# Patient Record
Sex: Female | Born: 1995 | Race: Black or African American | Hispanic: No | Marital: Single | Smoking: Never smoker
Health system: Southern US, Community
[De-identification: ages and names within clinical notes are randomized; demographics above are authoritative.]

## PROBLEM LIST (undated history)

## (undated) DIAGNOSIS — E119 Type 2 diabetes mellitus without complications: Secondary | ICD-10-CM

## (undated) HISTORY — PX: NO PAST SURGERIES: SHX2092

---

## 2018-12-05 HISTORY — PX: WRIST FUSION: SHX839

## 2019-09-09 ENCOUNTER — Emergency Department (HOSPITAL_COMMUNITY)
Admission: EM | Admit: 2019-09-09 | Discharge: 2019-09-09 | Disposition: A | Discharge status: Home / Self Care | Payer: BC Managed Care – PPO | Attending: Emergency Medicine | Admitting: Emergency Medicine

## 2019-09-09 ENCOUNTER — Encounter (HOSPITAL_COMMUNITY): Payer: Self-pay | Admitting: Emergency Medicine

## 2019-09-09 ENCOUNTER — Other Ambulatory Visit: Payer: Self-pay

## 2019-09-09 DIAGNOSIS — E119 Type 2 diabetes mellitus without complications: Secondary | ICD-10-CM | POA: Diagnosis not present

## 2019-09-09 DIAGNOSIS — R197 Diarrhea, unspecified: Secondary | ICD-10-CM

## 2019-09-09 HISTORY — DX: Type 2 diabetes mellitus without complications: E11.9

## 2019-09-09 LAB — URINALYSIS, ROUTINE W REFLEX MICROSCOPIC
Bilirubin Urine: NEGATIVE
Glucose, UA: 150 mg/dL — AB
Hgb urine dipstick: NEGATIVE
Ketones, ur: NEGATIVE mg/dL
Leukocytes,Ua: NEGATIVE
Nitrite: NEGATIVE
Protein, ur: NEGATIVE mg/dL
Specific Gravity, Urine: 1.004 — ABNORMAL LOW (ref 1.005–1.030)
pH: 6 (ref 5.0–8.0)

## 2019-09-09 LAB — I-STAT CHEM 8, ED
BUN: 14 mg/dL (ref 6–20)
Calcium, Ion: 1.23 mmol/L (ref 1.15–1.40)
Chloride: 103 mmol/L (ref 98–111)
Creatinine, Ser: 0.5 mg/dL (ref 0.44–1.00)
Glucose, Bld: 216 mg/dL — ABNORMAL HIGH (ref 70–99)
HCT: 42 % (ref 36.0–46.0)
Hemoglobin: 14.3 g/dL (ref 12.0–15.0)
Potassium: 3.7 mmol/L (ref 3.5–5.1)
Sodium: 137 mmol/L (ref 135–145)
TCO2: 21 mmol/L — ABNORMAL LOW (ref 22–32)

## 2019-09-09 LAB — POC URINE PREG, ED: Preg Test, Ur: NEGATIVE

## 2019-09-09 MED ORDER — ALUM & MAG HYDROXIDE-SIMETH 200-200-20 MG/5ML PO SUSP
30.0000 mL | Freq: Once | ORAL | Status: AC
  Administered 2019-09-09: 30 mL via ORAL
  Filled 2019-09-09: qty 30

## 2019-09-09 MED ORDER — ONDANSETRON 8 MG PO TBDP
8.0000 mg | ORAL_TABLET | Freq: Once | ORAL | Status: AC
  Administered 2019-09-09: 05:00:00 8 mg via ORAL
  Filled 2019-09-09: qty 1

## 2019-09-09 MED ORDER — ACETAMINOPHEN 500 MG PO TABS
1000.0000 mg | ORAL_TABLET | Freq: Once | ORAL | Status: AC
  Administered 2019-09-09: 05:00:00 1000 mg via ORAL
  Filled 2019-09-09: qty 2

## 2019-09-09 MED ORDER — DICYCLOMINE HCL 20 MG PO TABS: 20.0000 mg | ORAL_TABLET | Freq: Two times a day (BID) | ORAL | 0 refills | Status: DC

## 2019-09-09 MED ORDER — DICYCLOMINE HCL 10 MG PO CAPS
10.0000 mg | ORAL_CAPSULE | Freq: Once | ORAL | Status: AC
  Administered 2019-09-09: 10 mg via ORAL
  Filled 2019-09-09: qty 1

## 2019-09-09 NOTE — ED Triage Notes (Signed)
Pt complaint of ongoing diarrhea and chills; tested for COVID Tuesday at a [facility] respiratory clinic; negative per pt.

## 2019-09-09 NOTE — ED Provider Notes (Signed)
Sherry Clarke   CSN: [id number] Arrival date & time: 09/09/19  0259     History   Chief Complaint Chief Complaint  Patient presents with  . Diarrhea    HPI Sherry Clarke is a 23 y.o. female.     The history is provided by the patient.  Diarrhea Quality:  Watery Severity:  Moderate Onset quality:  Gradual Duration:  5 hours Timing:  Sporadic Progression:  Unchanged Relieved by:  Nothing Worsened by:  Nothing Ineffective treatments:  None tried Associated symptoms: no abdominal pain, no arthralgias, no chills, no recent cough, no diaphoresis, no fever, no headaches, no myalgias, no URI and no vomiting   Risk factors: suspect food intake   Risk factors: no recent antibiotic use   Ate an alfredo pasta and developed diarrhea and cramping starting at 1030 pm.  No vomiting.  No f/c/r.    Past Medical History:  Diagnosis Date  . Diabetes mellitus without complication ([place])     There are no active problems to display for this patient.   History reviewed. No pertinent surgical history.   OB History   No obstetric history on file.      Home Medications    Prior to Admission medications   Not on File    Family History No family history on file.  Social History Social History   Tobacco Use  . Smoking status: Never Smoker  . Smokeless tobacco: Never Used  Substance Use Topics  . Alcohol use: Never    Frequency: Never  . Drug use: Never     Allergies   Patient has no known allergies.   Review of Systems Review of Systems  Constitutional: Negative for chills, diaphoresis and fever.  HENT: Negative for congestion.   Eyes: Negative for visual disturbance.  Respiratory: Negative for cough and shortness of breath.   Cardiovascular: Negative for chest pain.  Gastrointestinal: Positive for diarrhea. Negative for abdominal pain and vomiting.  Genitourinary: Negative for difficulty urinating.   Musculoskeletal: Negative for arthralgias and myalgias.  Neurological: Negative for headaches.  Psychiatric/Behavioral: Negative for agitation.  All other systems reviewed and are negative.    Physical Exam Updated Vital Signs BP (!) 139/99   Pulse 83   Temp 98 F (36.7 C) (Oral)   Resp 18   Ht 5\' 9"  (1.753 m)   Wt 120.2 kg   LMP 08/18/2019   SpO2 100%   BMI 39.13 kg/m   Physical Exam Vitals signs and nursing Clarke reviewed.  Constitutional:      General: She is not in acute distress.    Appearance: Normal appearance.  HENT:     Head: Normocephalic and atraumatic.     Nose: Nose normal.  Eyes:     Conjunctiva/sclera: Conjunctivae normal.     Pupils: Pupils are equal, round, and reactive to light.  Neck:     Musculoskeletal: Normal range of motion and neck supple.  Cardiovascular:     Rate and Rhythm: Normal rate and regular rhythm.     Pulses: Normal pulses.     Heart sounds: Normal heart sounds.  Pulmonary:     Effort: Pulmonary effort is normal.     Breath sounds: Normal breath sounds.  Abdominal:     General: Abdomen is flat. Bowel sounds are increased. There is no distension.     Palpations: There is no mass.     Tenderness: There is no abdominal tenderness. There is no guarding or rebound.  Negative signs include Murphy's sign, Rovsing's sign and McBurney's sign.  Musculoskeletal: Normal range of motion.  Skin:    General: Skin is warm and dry.     Capillary Refill: Capillary refill takes less than 2 seconds.  Neurological:     General: No focal deficit present.     Mental Status: She is alert and oriented to person, place, and time.  Psychiatric:        Mood and Affect: Mood normal.        Behavior: Behavior normal.      ED Treatments / Results  Labs (all labs ordered are listed, but only abnormal results are displayed) Labs Reviewed  URINALYSIS, ROUTINE W REFLEX MICROSCOPIC  POC URINE PREG, ED  I-STAT CHEM 8, ED    EKG None  Radiology No  results found.  Procedures Procedures (including critical care time)  Medications Ordered in ED Medications  ondansetron (ZOFRAN-ODT) disintegrating tablet 8 mg (has no administration in time range)  alum & mag hydroxide-simeth (MAALOX/MYLANTA) 200-200-20 MG/5ML suspension 30 mL (has no administration in time range)  dicyclomine (BENTYL) capsule 10 mg (has no administration in time range)  acetaminophen (TYLENOL) tablet 1,000 mg (has no administration in time range)     Likely food related.  Normal vitals, exam and electrolytes. I do not believe imagine is necessary at this time. May take immodium for diarrhea.  Bland diet instructions given.  PO challenged successfully.    Sherry Clarke was evaluated in Emergency Department on 09/09/2019 for the symptoms described in the history of present illness. She was evaluated in the context of the global COVID-19 pandemic, which necessitated consideration that the patient might be at risk for infection with the SARS-CoV-2 virus that causes COVID-19. Institutional protocols and algorithms that pertain to the evaluation of patients at risk for COVID-19 are in a state of rapid change based on information released by regulatory bodies including the CDC and federal and state organizations. These policies and algorithms were followed during the patient's care in the ED.  Final Clinical Impressions(s) / ED Diagnoses  Return for intractable cough, coughing up blood,fevers >100.4 unrelieved by medication, shortness of breath, intractable vomiting, chest pain, shortness of breath, weakness,numbness, changes in speech, facial asymmetry,abdominal pain, passing out,Inability to tolerate liquids or food, cough, altered mental status or any concerns. No signs of systemic illness or infection. The patient is nontoxic-appearing on exam and vital signs are within normal limits.   I have reviewed the triage vital signs and the nursing notes. Pertinent labs  &imaging results that were available during my care of the patient were reviewed by me and considered in my medical decision making (see chart for details).After history, exam, and medical workup I feel the patient has beenappropriately medically screened and is safe for discharge home. Pertinent diagnoses were discussed with the patient. Patient was given return precautions.     Shelsy Seng, MD 09/09/19 2025

## 2020-09-09 ENCOUNTER — Inpatient Hospital Stay (HOSPITAL_COMMUNITY)
Admission: AD | Admit: 2020-09-09 | Discharge: 2020-09-09 | Disposition: A | Discharge status: Home / Self Care | Payer: 59 | Attending: Obstetrics and Gynecology | Admitting: Obstetrics and Gynecology

## 2020-09-09 ENCOUNTER — Other Ambulatory Visit: Payer: Self-pay

## 2020-09-09 ENCOUNTER — Encounter (HOSPITAL_COMMUNITY): Payer: Self-pay | Admitting: *Deleted

## 2020-09-09 DIAGNOSIS — O99891 Other specified diseases and conditions complicating pregnancy: Secondary | ICD-10-CM

## 2020-09-09 DIAGNOSIS — Z3A22 22 weeks gestation of pregnancy: Secondary | ICD-10-CM | POA: Diagnosis not present

## 2020-09-09 DIAGNOSIS — R079 Chest pain, unspecified: Secondary | ICD-10-CM | POA: Insufficient documentation

## 2020-09-09 DIAGNOSIS — Z79899 Other long term (current) drug therapy: Secondary | ICD-10-CM | POA: Insufficient documentation

## 2020-09-09 DIAGNOSIS — K219 Gastro-esophageal reflux disease without esophagitis: Secondary | ICD-10-CM

## 2020-09-09 DIAGNOSIS — O26892 Other specified pregnancy related conditions, second trimester: Secondary | ICD-10-CM | POA: Insufficient documentation

## 2020-09-09 DIAGNOSIS — O99612 Diseases of the digestive system complicating pregnancy, second trimester: Secondary | ICD-10-CM

## 2020-09-09 DIAGNOSIS — O24112 Pre-existing diabetes mellitus, type 2, in pregnancy, second trimester: Secondary | ICD-10-CM | POA: Insufficient documentation

## 2020-09-09 LAB — COMPREHENSIVE METABOLIC PANEL
ALT: 20 U/L (ref 0–44)
AST: 15 U/L (ref 15–41)
Albumin: 2.5 g/dL — ABNORMAL LOW (ref 3.5–5.0)
Alkaline Phosphatase: 72 U/L (ref 38–126)
Anion gap: 6 (ref 5–15)
BUN: 7 mg/dL (ref 6–20)
CO2: 22 mmol/L (ref 22–32)
Calcium: 8.4 mg/dL — ABNORMAL LOW (ref 8.9–10.3)
Chloride: 105 mmol/L (ref 98–111)
Creatinine, Ser: 0.55 mg/dL (ref 0.44–1.00)
GFR calc non Af Amer: >60 mL/min (ref >60)
Glucose, Bld: 192 mg/dL — ABNORMAL HIGH (ref 70–99)
Potassium: 3.7 mmol/L (ref 3.5–5.1)
Sodium: 133 mmol/L — ABNORMAL LOW (ref 135–145)
Total Bilirubin: <0.1 mg/dL — ABNORMAL LOW (ref 0.3–1.2)
Total Protein: 5.3 g/dL — ABNORMAL LOW (ref 6.5–8.1)

## 2020-09-09 LAB — CBC
HCT: 35.3 % — ABNORMAL LOW (ref 36.0–46.0)
Hemoglobin: 11.2 g/dL — ABNORMAL LOW (ref 12.0–15.0)
MCH: 28.1 pg (ref 26.0–34.0)
MCHC: 31.7 g/dL (ref 30.0–36.0)
MCV: 88.5 fL (ref 80.0–100.0)
Platelets: 211 10*3/uL (ref 150–400)
RBC: 3.99 MIL/uL (ref 3.87–5.11)
RDW: 13 % (ref 11.5–15.5)
WBC: 8.3 10*3/uL (ref 4.0–10.5)
nRBC: 0 % (ref 0.0–0.2)

## 2020-09-09 LAB — TROPONIN I (HIGH SENSITIVITY): Troponin I (High Sensitivity): 2 ng/L (ref <18)

## 2020-09-09 LAB — D-DIMER, QUANTITATIVE: D-Dimer, Quant: 0.56 ug/mL-FEU — ABNORMAL HIGH (ref 0.00–0.50)

## 2020-09-09 MED ORDER — ALUM & MAG HYDROXIDE-SIMETH 200-200-20 MG/5ML PO SUSP
30.0000 mL | Freq: Once | ORAL | Status: AC
  Administered 2020-09-09: 30 mL via ORAL
  Filled 2020-09-09: qty 30

## 2020-09-09 MED ORDER — ACETAMINOPHEN 500 MG PO TABS
1000.0000 mg | ORAL_TABLET | ORAL | Status: AC
  Administered 2020-09-09: 1000 mg via ORAL
  Filled 2020-09-09: qty 2

## 2020-09-09 MED ORDER — LIDOCAINE VISCOUS HCL 2 % MT SOLN
15.0000 mL | Freq: Once | OROMUCOSAL | Status: AC
  Administered 2020-09-09: 15 mL via ORAL
  Filled 2020-09-09: qty 15

## 2020-09-09 NOTE — MAU Note (Signed)
Started having chest pain, constant pain from chest to rt arm, started around 1230. Was sitting at desk when started.  Never had the chest pain before.  Has noted a strange heart beat, beats fast for a while, gets short of breath when it happens.  This has happened prior to pregnancy.

## 2020-09-09 NOTE — MAU Provider Note (Signed)
History     CSN: [id number]  Arrival date and time: 09/09/20 1416   None     Chief Complaint  Patient presents with  . Chest Pain  . Palpitations   HPI  Patient is a g1p0 at [redacted]w[redacted]d who presents to MAU with chest pain. Reports acute onset of chest pain at 12:30 today. No exacerbating factor or precipitating reason. Describes pain as a squeezing, mid sternal pain that radiates to right shoulder. Hard to take a deep breath, but does not feel short of breath. Reports a history of feeling palpitations occasionally, has felt them more often in the past week. Reports intermittent nausea this week but none currently. Denies flushing or sweating. No other associated symptoms.  Reports recent travel history of driving for 4 hours. No history of blood clots. Reports symmetric leg swelling that is unchanged from prior.    Denies cough. No recent sicknesses or sick contacts.  Hx of DMI. Blood sugars have been in 90s-130s.   OB History    Gravida  1   Para      Term      Preterm      AB      Living        SAB      TAB      Ectopic      Multiple      Live Births              Past Medical History:  Diagnosis Date  . Diabetes mellitus without complication ([facility])     No past surgical history on file.  No family history on file.  Social History   Tobacco Use  . Smoking status: Never Smoker  . Smokeless tobacco: Never Used  Substance Use Topics  . Alcohol use: Never  . Drug use: Never    Allergies: No Known Allergies  Medications Prior to Admission  Medication Sig Dispense Refill Last Dose  . dicyclomine (BENTYL) 20 MG tablet Take 1 tablet (20 mg total) by mouth 2 (two) times daily. 20 tablet 0     Review of Systems  Constitutional: Negative for chills.  Respiratory: Negative for cough, shortness of breath and wheezing.   Cardiovascular: Negative for leg swelling.   Physical Exam   Blood pressure 136/77, pulse (!) 107, temperature 98.6 F (37 C),  temperature source Oral, resp. rate 20, height 5\' 9"  (1.753 m), weight 127.6 kg, SpO2 100 %.  Physical Exam Vitals and nursing note reviewed.  Constitutional:      General: She is not in acute distress.    Appearance: She is well-developed. She is not ill-appearing, toxic-appearing or diaphoretic.  Cardiovascular:     Heart sounds: Normal heart sounds. No murmur heard.   Pulmonary:     Effort: Pulmonary effort is normal.     Breath sounds: Normal breath sounds.  Chest:     Chest wall: No tenderness.  Musculoskeletal:     Comments: Trace nonpitting edema, neg homans sign   Skin:    General: Skin is warm and dry.  Neurological:     General: No focal deficit present.     Mental Status: She is alert.  Psychiatric:        Mood and Affect: Mood normal.        Behavior: Behavior normal.     MAU Course  Procedures  MDM Pt evaluated  EKG, CMP, Troponin, D Dimer  EKG reviewed - normal sinus rhythm w HR 82. No abnormalities.  Labs reviewed and unremarkable.  GI cocktrail, tylenol administered  Pt feeling improved    Assessment and Plan   Chest pain - suspect GERD as etiology, reassuring labwork and EKG.  -improved w GI cocktail, tylenol  -encouraged to continue PTA nexium -stable for dc   Gita Kudo 09/09/2020, 2:49 PM

## 2020-11-06 ENCOUNTER — Other Ambulatory Visit: Payer: Self-pay | Admitting: Obstetrics and Gynecology

## 2020-11-06 DIAGNOSIS — Z363 Encounter for antenatal screening for malformations: Secondary | ICD-10-CM

## 2020-11-06 DIAGNOSIS — Z3A34 34 weeks gestation of pregnancy: Secondary | ICD-10-CM

## 2020-11-06 DIAGNOSIS — O24019 Pre-existing diabetes mellitus, type 1, in pregnancy, unspecified trimester: Secondary | ICD-10-CM

## 2020-11-26 ENCOUNTER — Other Ambulatory Visit: Payer: Self-pay

## 2020-11-26 ENCOUNTER — Encounter: Payer: Self-pay | Admitting: *Deleted

## 2020-11-26 ENCOUNTER — Ambulatory Visit: Payer: BC Managed Care – PPO | Admitting: *Deleted

## 2020-11-26 ENCOUNTER — Other Ambulatory Visit: Payer: Self-pay | Admitting: Obstetrics and Gynecology

## 2020-11-26 ENCOUNTER — Ambulatory Visit: Payer: BC Managed Care – PPO | Attending: Obstetrics and Gynecology

## 2020-11-26 VITALS — BP 109/79 | HR 105

## 2020-11-26 DIAGNOSIS — Z3A34 34 weeks gestation of pregnancy: Secondary | ICD-10-CM

## 2020-11-26 DIAGNOSIS — O99213 Obesity complicating pregnancy, third trimester: Secondary | ICD-10-CM | POA: Diagnosis not present

## 2020-11-26 DIAGNOSIS — O24019 Pre-existing diabetes mellitus, type 1, in pregnancy, unspecified trimester: Secondary | ICD-10-CM | POA: Diagnosis present

## 2020-11-26 DIAGNOSIS — Z363 Encounter for antenatal screening for malformations: Secondary | ICD-10-CM

## 2020-11-26 DIAGNOSIS — E669 Obesity, unspecified: Secondary | ICD-10-CM

## 2020-11-26 DIAGNOSIS — O24013 Pre-existing diabetes mellitus, type 1, in pregnancy, third trimester: Secondary | ICD-10-CM | POA: Diagnosis present

## 2020-11-26 DIAGNOSIS — O24313 Unspecified pre-existing diabetes mellitus in pregnancy, third trimester: Secondary | ICD-10-CM

## 2020-11-26 DIAGNOSIS — Z794 Long term (current) use of insulin: Secondary | ICD-10-CM

## 2020-11-30 ENCOUNTER — Other Ambulatory Visit: Payer: BC Managed Care – PPO

## 2020-12-01 ENCOUNTER — Other Ambulatory Visit: Payer: BC Managed Care – PPO

## 2020-12-05 ENCOUNTER — Other Ambulatory Visit: Payer: Self-pay

## 2020-12-05 ENCOUNTER — Emergency Department (HOSPITAL_COMMUNITY)
Admission: EM | Admit: 2020-12-05 | Discharge: 2020-12-05 | Disposition: A | Discharge status: Home / Self Care | Payer: 59 | Attending: Emergency Medicine | Admitting: Emergency Medicine

## 2020-12-05 ENCOUNTER — Encounter (HOSPITAL_COMMUNITY): Payer: Self-pay | Admitting: *Deleted

## 2020-12-05 DIAGNOSIS — Z3A35 35 weeks gestation of pregnancy: Secondary | ICD-10-CM | POA: Diagnosis not present

## 2020-12-05 DIAGNOSIS — O24414 Gestational diabetes mellitus in pregnancy, insulin controlled: Secondary | ICD-10-CM | POA: Diagnosis not present

## 2020-12-05 DIAGNOSIS — Z7982 Long term (current) use of aspirin: Secondary | ICD-10-CM | POA: Diagnosis not present

## 2020-12-05 DIAGNOSIS — R0602 Shortness of breath: Secondary | ICD-10-CM | POA: Diagnosis present

## 2020-12-05 DIAGNOSIS — U071 COVID-19: Secondary | ICD-10-CM | POA: Insufficient documentation

## 2020-12-05 DIAGNOSIS — O98513 Other viral diseases complicating pregnancy, third trimester: Secondary | ICD-10-CM | POA: Diagnosis not present

## 2020-12-05 LAB — CBC
HCT: 39.5 % (ref 36.0–46.0)
Hemoglobin: 12.6 g/dL (ref 12.0–15.0)
MCH: 28.4 pg (ref 26.0–34.0)
MCHC: 31.9 g/dL (ref 30.0–36.0)
MCV: 89.2 fL (ref 80.0–100.0)
Platelets: 150 10*3/uL (ref 150–400)
RBC: 4.43 MIL/uL (ref 3.87–5.11)
RDW: 13.6 % (ref 11.5–15.5)
WBC: 4.9 10*3/uL (ref 4.0–10.5)
nRBC: 0 % (ref 0.0–0.2)

## 2020-12-05 LAB — RESP PANEL BY RT-PCR (FLU A&B, COVID) ARPGX2
Influenza A by PCR: NEGATIVE
Influenza B by PCR: NEGATIVE
SARS Coronavirus 2 by RT PCR: POSITIVE — AB

## 2020-12-05 LAB — BASIC METABOLIC PANEL
Anion gap: 9 (ref 5–15)
BUN: 8 mg/dL (ref 6–20)
CO2: 19 mmol/L — ABNORMAL LOW (ref 22–32)
Calcium: 8.5 mg/dL — ABNORMAL LOW (ref 8.9–10.3)
Chloride: 107 mmol/L (ref 98–111)
Creatinine, Ser: 0.55 mg/dL (ref 0.44–1.00)
GFR, Estimated: >60 mL/min (ref >60)
Glucose, Bld: 114 mg/dL — ABNORMAL HIGH (ref 70–99)
Potassium: 4 mmol/L (ref 3.5–5.1)
Sodium: 135 mmol/L (ref 135–145)

## 2020-12-05 MED ORDER — ALBUTEROL SULFATE HFA 108 (90 BASE) MCG/ACT IN AERS: 2.0000 | INHALATION_SPRAY | RESPIRATORY_TRACT | Status: DC | PRN

## 2020-12-05 NOTE — ED Provider Notes (Signed)
Sherry Clarke EMERGENCY DEPARTMENT Provider Note   CSN: [id number] Arrival date & time: 12/05/20  1809     History Chief Complaint  Patient presents with  . Covid Positive    Sherry Clarke is a 25 y.o. female.  HPI      Sherry Clarke is a 25 y.o. female, with a history of DM, presenting to the ED with nasal congestion and rhinorrhea beginning December 25. Patient began to have cough December 27. She tested positive for Covid at [facility] yesterday.  She was told to come to the emergency room if she began to have shortness of breath.  She states she has had some shortness of breath, but it is only when she lies flat on her back.  She does state she has had shortness of breath when she lies flat on her back at this stage in pregnancy anyway. Patient states she is [redacted] weeks pregnant with estimated due date February 3.  She is followed by [facility] OB[facility]. Denies fever/chills, chest pain, dizziness, syncope, unilateral lower extremity edema/pain, other shortness of breath, exertional symptoms, N/V/D, abdominal pain, vaginal bleeding, or any other complaints.   Past Medical History:  Diagnosis Date  . Diabetes mellitus without complication ([facility])     There are no problems to display for this patient.   Past Surgical History:  Procedure Laterality Date  . NO PAST SURGERIES       OB History    Gravida  1   Para      Term      Preterm      AB      Living        SAB      IAB      Ectopic      Multiple      Live Births              Family History  Problem Relation Age of Onset  . Hypertension Mother   . Heart disease Mother     Social History   Tobacco Use  . Smoking status: Never Smoker  . Smokeless tobacco: Never Used  Vaping Use  . Vaping Use: Never used  Substance Use Topics  . Alcohol use: Never  . Drug use: Never    Home Medications Prior to Admission medications   Medication Sig Start Date End Date Taking? Authorizing  Provider  aspirin EC 81 MG tablet Take 81 mg by mouth daily. Swallow whole.    [provider]  dicyclomine (BENTYL) 20 MG tablet Take 1 tablet (20 mg total) by mouth 2 (two) times daily. Patient not taking: Reported on 11/26/2020 09/09/19   Palumbo, April, MD  insulin aspart (NOVOLOG) 100 UNIT/ML injection Inject into the skin 3 (three) times daily before meals. Pt taking 5 x day    [provider]  insulin glargine (LANTUS) 100 UNIT/ML injection Inject 46 Units into the skin daily.    [provider]  Prenatal Vit-Fe Fumarate-FA (PRENATAL MULTIVITAMIN) TABS tablet Take 1 tablet by mouth daily at 12 noon.    [provider]    Allergies    Patient has no known allergies.  Review of Systems   Review of Systems  Constitutional: Negative for chills and fever.  Respiratory: Positive for cough and shortness of breath.   Cardiovascular: Negative for chest pain and leg swelling.  Gastrointestinal: Negative for abdominal pain, diarrhea, nausea and vomiting.  Genitourinary: Negative for dysuria and vaginal bleeding.  Neurological: Negative for dizziness, syncope and weakness.  All other systems reviewed and are negative.   Physical Exam Updated Vital Signs BP (!) 139/93 (BP Location: Right Arm)   Pulse (!) 112   Temp 98 F (36.7 C) (Oral)   Resp 18   LMP 04/02/2020   SpO2 100%   Physical Exam Vitals and nursing note reviewed.  Constitutional:      General: She is not in acute distress.    Appearance: She is well-developed. She is not diaphoretic.  HENT:     Head: Normocephalic and atraumatic.     Mouth/Throat:     Mouth: Mucous membranes are moist.     Pharynx: Oropharynx is clear.  Eyes:     Conjunctiva/sclera: Conjunctivae normal.  Cardiovascular:     Rate and Rhythm: Normal rate and regular rhythm.     Pulses: Normal pulses.          Radial pulses are 2+ on the right side and 2+ on the left side.       Posterior tibial pulses are 2+ on  the right side and 2+ on the left side.     Heart sounds: Normal heart sounds.     Comments: Tactile temperature in the extremities appropriate and equal bilaterally. Pulmonary:     Effort: Pulmonary effort is normal. No respiratory distress.     Breath sounds: Normal breath sounds.     Comments: No increased work of breathing.  Speaks in full sentences without difficulty. Abdominal:     Palpations: Abdomen is soft.     Tenderness: There is no abdominal tenderness. There is no guarding.  Musculoskeletal:     Cervical back: Neck supple.     Right lower leg: No edema.     Left lower leg: No edema.  Lymphadenopathy:     Cervical: No cervical adenopathy.  Skin:    General: Skin is warm and dry.  Neurological:     Mental Status: She is alert.  Psychiatric:        Mood and Affect: Mood and affect normal.        Speech: Speech normal.        Behavior: Behavior normal.     ED Results / Procedures / Treatments   Labs (all labs ordered are listed, but only abnormal results are displayed) Labs Reviewed  RESP PANEL BY RT-PCR (FLU A&B, COVID) ARPGX2 - Abnormal; Notable for the following components:      Result Value   SARS Coronavirus 2 by RT PCR POSITIVE (*)    All other components within normal limits  BASIC METABOLIC PANEL - Abnormal; Notable for the following components:   CO2 19 (*)    Glucose, Bld 114 (*)    Calcium 8.5 (*)    All other components within normal limits  CBC    EKG None  Radiology No results found.  Procedures Procedures (including critical care time)  Medications Ordered in ED Medications - No data to display  ED Course  I have reviewed the triage vital signs and the nursing notes.  Pertinent labs & imaging results that were available during my care of the patient were reviewed by me and considered in my medical decision making (see chart for details).  Clinical Course as of 12/06/20 1606  Sat Dec 05, 2020  7628 Spoke with Dr. Henderson Cloud, OBGYN.   We discussed the patient's symptoms, vital signs, and physical exam findings. We discussed the fact that we no longer have MAB infusions  available as an option. She states that as soon as the patient has her monitoring performed by OB rapid response and is cleared, she can be discharged. Follow-up next week in the office. [SJ]  1917 Spoke with Leigh, OB rapid response.  States she will come evaluate the patient. [SJ]  2011 Leigh, Rapid OB RN, states they have completed their monitoring and patient is cleared from an OB point of view. [SJ]    Clinical Course User Index [SJ] Natlie Asfour, Helane Gunther, PA-C   MDM Rules/Calculators/A&P                          Patient presents with nasal congestion, rhinorrhea, cough, occasional shortness of breath in the setting of COVID-19. Patient is nontoxic appearing, afebrile, not tachypneic, not hypotensive, maintains excellent SPO2 on room air, and is in no apparent distress.   I have reviewed the patient's chart to obtain more information.   I reviewed and interpreted the patient's labs. Patient to follow-up in the office with OB/GYN. The patient was given instructions for home care as well as return precautions. Patient voices understanding of these instructions, accepts the plan, and is comfortable with discharge.    Vitals:   12/05/20 2000 12/05/20 2045 12/05/20 2135 12/05/20 2139  BP: 137/88 137/88 119/82   Pulse: 91 94 (!) 101   Resp: 17 20 (!) 23   Temp:    98.4 F (36.9 C)  TempSrc:    Oral  SpO2: 97% 98% 97%   Weight:      Height:       Katriel L Forlenza was evaluated in Emergency Department on 12/05/2020 for the symptoms described in the history of present illness. She was evaluated in the context of the global COVID-19 pandemic, which necessitated consideration that the patient might be at risk for infection with the SARS-CoV-2 virus that causes COVID-19. Institutional protocols and algorithms that pertain to the evaluation of patients at risk for  COVID-19 are in a state of rapid change based on information released by regulatory bodies including the CDC and federal and state organizations. These policies and algorithms were followed during the patient's care in the ED.  Final Clinical Impression(s) / ED Diagnoses Final diagnoses:  [id number]    Rx / DC Orders ED Discharge Orders    None       Layla Maw 12/06/20 1607    Lajean Saver, MD 12/06/20 2045

## 2020-12-05 NOTE — Progress Notes (Signed)
Notified of patient status, vital signs, no apparent distress as well as FHR tracing. Pt can be cleared from [facility] and continue care with ER providers.

## 2020-12-05 NOTE — ED Notes (Addendum)
[redacted] weeks pregnant.  G1P0. Type 1 diabetic. [facility] LOV 12-02-20, u/s done.  Pt has been getting NST done twice weekly x 1 month.

## 2020-12-05 NOTE — L&D Delivery Note (Signed)
Delivery Note Patient pushed well for 2 hours.  At 7:15 AM a viable female was delivered via Vaginal, Spontaneous (Presentation:   Occiput Anterior).  APGAR: 3, 7; weight 3635 gm (8lb 0.2oz).  Following delivery of the head, the anterior shoulder easily delivered but the remainder of the body was slow to deliver.  Upon delivery, tone and respiratory effort were poor, so the cord was cut and clamped and baby take to the warmer for stimulation.  The NICU team evaluated.  Baby was skin to skin after delivery of the placenta    Placenta status: Spontaneous, Intact.  Cord: 3 vessels with the following complications: None.  Cord pH: collected and pending  Vaginal bleeding following placenta delivery was appropriate, but the uterus was boggy and did not contract well despite postpartum pitocin.  Given the risk factors for postpartum hemorrhage (prolonged IOL, magnesium sulfate), 1000 mcg of rectal cytotec and 1000 mg TXA were given with improvement in uterine tone   Anesthesia: Epidural Episiotomy: None Lacerations: None Suture Repair: n/a Est. Blood Loss (mL): 500  Mom to postpartum.  Baby to Couplet care / Skin to Skin.  Sakib Noguez GEFFEL Shaleta Ruacho 12/15/2020, 8:01 AM

## 2020-12-05 NOTE — ED Notes (Signed)
OB RR at bedside 

## 2020-12-05 NOTE — ED Notes (Signed)
Per OB RRN, pt is cleared with OB after 20 min strip obtained.

## 2020-12-05 NOTE — Discharge Instructions (Addendum)
COVID-19 Home Management:  You have had a positive test for COVID-19. COVID-19 is caused by a virus. Viruses do not require or respond to antibiotics. Treatment is symptomatic care and it is important to note that these symptoms may last for 7-14 days.   Hand washing: Wash your hands throughout the day, but especially before and after touching the face, using the restroom, sneezing, coughing, or touching surfaces that have been coughed or sneezed upon. Hydration: Symptoms of most illnesses will be intensified and complicated by dehydration. Dehydration can also extend the duration of symptoms. Drink plenty of fluids and get plenty of rest. You should be drinking at least half a liter of water an hour to stay hydrated. Electrolyte drinks (ex. Gatorade, Powerade, Pedialyte) are also encouraged. You should be drinking enough fluids to make your urine light yellow, almost clear. If this is not the case, you are not drinking enough water. Please note that some of the treatments indicated below will not be effective if you are not adequately hydrated. Diet: Please concentrate on hydration, however, you may introduce food slowly.  Start with a clear liquid diet, progressed to a full liquid diet, and then bland solids as you are able. Pain or fever: Acetaminophen (generic for Tylenol) for pain or fever. Acetaminophen: May take acetaminophen (generic for Tylenol), as needed, for pain. Your daily total maximum amount of acetaminophen from all sources should be limited to 4000mg /day for persons without liver problems, or 2000mg /day for those with liver problems. Cough: Teas, warm liquids, broths, and honey can help with cough. Zyrtec or Claritin: May add these medication daily to control underlying symptoms of congestion, sneezing, and other signs of allergies.  These medications are available over-the-counter. Generics: Cetirizine (generic for Zyrtec) and loratadine (generic for Claritin). Fluticasone: Use  fluticasone (generic for Flonase), as directed, for nasal and sinus congestion.  This medication is available over-the-counter. Congestion: Plain guaifenesin (generic for plain Mucinex) may help relieve congestion. Saline sinus rinses and saline nasal sprays may also help relieve congestion.  Sore throat: Warm liquids or Chloraseptic spray may help soothe a sore throat. Gargle twice a day with a salt water solution made from a half teaspoon of salt in a cup of warm water.  Follow up: Recommend follow-up with OB/GYN next week.  Call to set up an appointment. Return: Return to the ED for significantly worsening symptoms, shortness of breath, persistent/worsening chest pain, persistent vomiting, large amounts of blood in stool, worsening/localized abdominal pain, or any other major concerns.  For prescription assistance, may try using prescription discount sites or apps, such as goodrx.com  COVID-19 isolation recommendations  Patients who have symptoms consistent with COVID-19 should self isolate until: At least 3 days (72 hours) have passed since recovery, defined as resolution of fever without the use of fever reducing medications and improvement in respiratory symptoms (e.g., cough, shortness of breath), and At least 7 days have passed since symptoms first appeared. Retesting is not required and not recommended as patients can continue to test positive for several weeks despite lack of symptoms.

## 2020-12-05 NOTE — ED Triage Notes (Signed)
Pt presents to ED POV. Pt c/o SOB. Pt states that she tested positive yesterday at [facility]. Pt 35w pregnant and told to come to ED if she gets SOB. resp e/u in triage

## 2020-12-07 ENCOUNTER — Inpatient Hospital Stay (HOSPITAL_COMMUNITY)
Admission: AD | Admit: 2020-12-07 | Discharge: 2020-12-07 | Disposition: A | Discharge status: Home / Self Care | Payer: 59 | Attending: Obstetrics and Gynecology | Admitting: Obstetrics and Gynecology

## 2020-12-07 ENCOUNTER — Inpatient Hospital Stay (HOSPITAL_COMMUNITY): Payer: 59

## 2020-12-07 ENCOUNTER — Encounter (HOSPITAL_COMMUNITY): Payer: Self-pay | Admitting: Obstetrics and Gynecology

## 2020-12-07 DIAGNOSIS — Z3A35 35 weeks gestation of pregnancy: Secondary | ICD-10-CM | POA: Insufficient documentation

## 2020-12-07 DIAGNOSIS — R030 Elevated blood-pressure reading, without diagnosis of hypertension: Secondary | ICD-10-CM | POA: Insufficient documentation

## 2020-12-07 DIAGNOSIS — R0602 Shortness of breath: Secondary | ICD-10-CM | POA: Diagnosis present

## 2020-12-07 DIAGNOSIS — O99513 Diseases of the respiratory system complicating pregnancy, third trimester: Secondary | ICD-10-CM | POA: Diagnosis not present

## 2020-12-07 DIAGNOSIS — U071 COVID-19: Secondary | ICD-10-CM | POA: Diagnosis not present

## 2020-12-07 DIAGNOSIS — J1282 Pneumonia due to coronavirus disease 2019: Secondary | ICD-10-CM | POA: Diagnosis not present

## 2020-12-07 DIAGNOSIS — Z7982 Long term (current) use of aspirin: Secondary | ICD-10-CM | POA: Insufficient documentation

## 2020-12-07 DIAGNOSIS — O24013 Pre-existing diabetes mellitus, type 1, in pregnancy, third trimester: Secondary | ICD-10-CM | POA: Diagnosis not present

## 2020-12-07 DIAGNOSIS — O26893 Other specified pregnancy related conditions, third trimester: Secondary | ICD-10-CM | POA: Diagnosis not present

## 2020-12-07 DIAGNOSIS — E109 Type 1 diabetes mellitus without complications: Secondary | ICD-10-CM

## 2020-12-07 DIAGNOSIS — Z794 Long term (current) use of insulin: Secondary | ICD-10-CM | POA: Insufficient documentation

## 2020-12-07 DIAGNOSIS — O98513 Other viral diseases complicating pregnancy, third trimester: Secondary | ICD-10-CM | POA: Insufficient documentation

## 2020-12-07 LAB — COMPREHENSIVE METABOLIC PANEL
ALT: 32 U/L (ref 0–44)
AST: 34 U/L (ref 15–41)
Albumin: 2.2 g/dL — ABNORMAL LOW (ref 3.5–5.0)
Alkaline Phosphatase: 137 U/L — ABNORMAL HIGH (ref 38–126)
Anion gap: 8 (ref 5–15)
BUN: 6 mg/dL (ref 6–20)
CO2: 18 mmol/L — ABNORMAL LOW (ref 22–32)
Calcium: 8.1 mg/dL — ABNORMAL LOW (ref 8.9–10.3)
Chloride: 109 mmol/L (ref 98–111)
Creatinine, Ser: 0.63 mg/dL (ref 0.44–1.00)
GFR, Estimated: >60 mL/min (ref >60)
Glucose, Bld: 86 mg/dL (ref 70–99)
Potassium: 3.7 mmol/L (ref 3.5–5.1)
Sodium: 135 mmol/L (ref 135–145)
Total Bilirubin: 0.7 mg/dL (ref 0.3–1.2)
Total Protein: 5.1 g/dL — ABNORMAL LOW (ref 6.5–8.1)

## 2020-12-07 LAB — CBC
HCT: 38 % (ref 36.0–46.0)
Hemoglobin: 12.5 g/dL (ref 12.0–15.0)
MCH: 28.5 pg (ref 26.0–34.0)
MCHC: 32.9 g/dL (ref 30.0–36.0)
MCV: 86.6 fL (ref 80.0–100.0)
Platelets: 141 10*3/uL — ABNORMAL LOW (ref 150–400)
RBC: 4.39 MIL/uL (ref 3.87–5.11)
RDW: 13.7 % (ref 11.5–15.5)
WBC: 4.6 10*3/uL (ref 4.0–10.5)
nRBC: 0 % (ref 0.0–0.2)

## 2020-12-07 LAB — C-REACTIVE PROTEIN: CRP: 1.5 mg/dL — ABNORMAL HIGH (ref <1.0)

## 2020-12-07 LAB — D-DIMER, QUANTITATIVE: D-Dimer, Quant: 2.31 ug/mL-FEU — ABNORMAL HIGH (ref 0.00–0.50)

## 2020-12-07 MED ORDER — FLUTICASONE PROPIONATE 50 MCG/ACT NA SUSP: 1.0000 | Freq: Two times a day (BID) | NASAL | 2 refills | Status: AC

## 2020-12-07 MED ORDER — ALBUTEROL SULFATE HFA 108 (90 BASE) MCG/ACT IN AERS: 2.0000 | INHALATION_SPRAY | Freq: Four times a day (QID) | RESPIRATORY_TRACT | 2 refills | Status: AC | PRN

## 2020-12-07 NOTE — MAU Provider Note (Addendum)
History     CSN: [id number]  Arrival date and time: 12/07/20 1735   Event Date/Time   First Provider Initiated Contact with Patient 12/07/20 1841      Chief Complaint  Patient presents with  . Chest Pain  . Shortness of Breath    Reports feeling "fluid in her throat"  . Covid Positive    Diagnosed with Friday at [facility] and Saturday [facility]   HPI This is a 24yo G1P0 at [redacted]w[redacted]d. She was diagnosed with COVID on 12/05/20. She complains of worsening SOB and cough. Decreased appetite. Feels like she is getting worse. No palliating or provoking factors. Has been taking mucinex - cough somewhat productive now. Mild chest pain, which is described more like fluid in chest when laying down.  OB History    Gravida  1   Para      Term      Preterm      AB      Living        SAB      IAB      Ectopic      Multiple      Live Births              Past Medical History:  Diagnosis Date  . Diabetes mellitus without complication [facility])     Past Surgical History:  Procedure Laterality Date  . NO PAST SURGERIES      Family History  Problem Relation Age of Onset  . Hypertension Mother   . Heart disease Mother     Social History   Tobacco Use  . Smoking status: Never Smoker  . Smokeless tobacco: Never Used  Vaping Use  . Vaping Use: Never used  Substance Use Topics  . Alcohol use: Never  . Drug use: Never    Allergies: No Known Allergies  Medications Prior to Admission  Medication Sig Dispense Refill Last Dose  . aspirin EC 81 MG tablet Take 81 mg by mouth daily. Swallow whole.   12/07/2020 at 1800  . insulin aspart (NOVOLOG) 100 UNIT/ML injection Inject 28 Units into the skin 4 (four) times daily.   12/07/2020 at 1500  . insulin glargine (LANTUS) 100 UNIT/ML injection Inject 45 Units into the skin at bedtime.   12/06/2020 at 2200  . Prenatal Vit-Fe Fumarate-FA (PRENATAL MULTIVITAMIN) TABS tablet Take 1 tablet by mouth daily at 12 noon.   12/07/2020 at 0900  . dicyclomine  (BENTYL) 20 MG tablet Take 1 tablet (20 mg total) by mouth 2 (two) times daily. (Patient not taking: Reported on 11/26/2020) 20 tablet 0     Review of Systems Physical Exam   Blood pressure 132/81, pulse (!) 102, temperature 98.2 F (36.8 C), temperature source Oral, resp. rate (!) 22, last menstrual period 04/02/2020, SpO2 99 %.  Physical Exam Vitals and nursing note reviewed.  Constitutional:      Appearance: She is well-developed.  HENT:     Head: Normocephalic and atraumatic.  Cardiovascular:     Rate and Rhythm: Normal rate and regular rhythm.     Heart sounds: Normal heart sounds.  Pulmonary:     Effort: Pulmonary effort is normal.     Breath sounds: Examination of the right-middle field reveals rales. Examination of the left-middle field reveals rales. Examination of the right-lower field reveals rales. Examination of the left-lower field reveals rales. Rales present.  Abdominal:     General: There is no abdominal bruit.     Palpations: Abdomen  is soft. There is no fluid wave or mass.     Tenderness: There is no guarding or rebound.  Musculoskeletal:     Cervical back: Normal range of motion.  Skin:    General: Skin is warm and dry.     Capillary Refill: Capillary refill takes less than 2 seconds.  Neurological:     General: No focal deficit present.     Mental Status: She is alert.  Psychiatric:        Mood and Affect: Mood normal.    Results for orders placed or performed during the hospital encounter of 12/07/20 (from the past 24 hour(s))  CBC     Status: Abnormal   Collection Time: 12/07/20  6:44 PM  Result Value Ref Range   WBC 4.6 4.0 - 10.5 K/uL   RBC 4.39 3.87 - 5.11 MIL/uL   Hemoglobin 12.5 12.0 - 15.0 g/dL   HCT 82.9 93.7 - 16.9 %   MCV 86.6 80.0 - 100.0 fL   MCH 28.5 26.0 - 34.0 pg   MCHC 32.9 30.0 - 36.0 g/dL   RDW 67.8 93.8 - 10.1 %   Platelets 141 (L) 150 - 400 K/uL   nRBC 0.0 0.0 - 0.2 %  Comprehensive metabolic panel     Status: Abnormal    Collection Time: 12/07/20  6:44 PM  Result Value Ref Range   Sodium 135 135 - 145 mmol/L   Potassium 3.7 3.5 - 5.1 mmol/L   Chloride 109 98 - 111 mmol/L   CO2 18 (L) 22 - 32 mmol/L   Glucose, Bld 86 70 - 99 mg/dL   BUN 6 6 - 20 mg/dL   Creatinine, Ser 7.51 0.44 - 1.00 mg/dL   Calcium 8.1 (L) 8.9 - 10.3 mg/dL   Total Protein 5.1 (L) 6.5 - 8.1 g/dL   Albumin 2.2 (L) 3.5 - 5.0 g/dL   AST 34 15 - 41 U/L   ALT 32 0 - 44 U/L   Alkaline Phosphatase 137 (H) 38 - 126 U/L   Total Bilirubin 0.7 0.3 - 1.2 mg/dL   GFR, Estimated >02 >58 mL/min   Anion gap 8 5 - 15  C-reactive protein     Status: Abnormal   Collection Time: 12/07/20  6:44 PM  Result Value Ref Range   CRP 1.5 (H) <1.0 mg/dL  D-dimer, quantitative (not at [facility])     Status: Abnormal   Collection Time: 12/07/20  6:50 PM  Result Value Ref Range   D-Dimer, Quant 2.31 (H) 0.00 - 0.50 ug/mL-FEU   DG CHEST PORT 1 VIEW  Result Date: 12/07/2020 CLINICAL DATA:  25 year old female with shortness of breath. Positive COVID-19. EXAM: PORTABLE CHEST 1 VIEW COMPARISON:  Chest radiograph dated 09/11/2020. FINDINGS: Shallow inspiration. Faint bilateral interstitial densities, likely atelectasis. Developing atypical infiltrate is less likely but not excluded clinical correlation is recommended. No focal consolidation, pleural effusion, pneumothorax. Stable cardiac silhouette. No acute osseous pathology. IMPRESSION: No focal consolidation. Electronically Signed   By: Elgie Collard M.D.   On: 12/07/2020 19:10    MAU Course  Procedures NST 140s, mod variability, accels. No decels.  MDM Known COVID positive. Will check labs and CXR.   Assessment and Plan     ICD-10-CM   1. [redacted] weeks gestation of pregnancy  Z3A.35   2. COVID  U07.1 DG CHEST PORT 1 VIEW    DG CHEST PORT 1 VIEW  3. Pneumonia due to COVID-19 virus  U07.1    J12.82  4. Controlled diabetes mellitus type 1 without complications ([place])  B15.1   5. Elevated BP without diagnosis  of hypertension  R03.0    Start flonase (recent [facility] study shows less progression to severe disease). Start albuterol inhaler. Reactive NST  Return precautions given.   Truett Mainland 12/07/2020, 6:43 PM

## 2020-12-07 NOTE — MAU Note (Signed)
Tested + for covid  Fri/Sat.  Went to ER on Sat for SOB per her MD.  Having sharp pains in chest, in the middle.  Nausea.  Productive cough at times. Feels like things are getting worse. No fever

## 2020-12-07 NOTE — Discharge Instructions (Signed)
10 Things You Can Do to Manage Your COVID-19 Symptoms at Home If you have possible or confirmed COVID-19: 1. Stay home from work and school. And stay away from other public places. If you must go out, avoid using any kind of public transportation, ridesharing, or taxis. 2. Monitor your symptoms carefully. If your symptoms get worse, call your healthcare provider immediately. 3. Get rest and stay hydrated. 4. If you have a medical appointment, call the healthcare provider ahead of time and tell them that you have or may have COVID-19. 5. For medical emergencies, call 911 and notify the dispatch personnel that you have or may have COVID-19. 6. Cover your cough and sneezes with a tissue or use the inside of your elbow. 7. Wash your hands often with soap and water for at least 20 seconds or clean your hands with an alcohol-based hand sanitizer that contains at least 60% alcohol. 8. As much as possible, stay in a specific room and away from other people in your home. Also, you should use a separate bathroom, if available. If you need to be around other people in or outside of the home, wear a mask. 9. Avoid sharing personal items with other people in your household, like dishes, towels, and bedding. 10. Clean all surfaces that are touched often, like counters, tabletops, and doorknobs. Use household cleaning sprays or wipes according to the label instructions. cdc.gov/coronavirus 06/05/2019 This information is not intended to replace advice given to you by your health care provider. Make sure you discuss any questions you have with your health care provider. Document Revised: 11/07/2019 Document Reviewed: 11/07/2019 Elsevier Patient Education  2020 Elsevier Inc.  

## 2020-12-07 NOTE — MAU Note (Signed)
Denies any OB problems, no LOF or bleeding.  Reports +FM

## 2020-12-12 ENCOUNTER — Encounter (HOSPITAL_COMMUNITY): Payer: Self-pay | Admitting: Obstetrics and Gynecology

## 2020-12-12 ENCOUNTER — Other Ambulatory Visit: Payer: Self-pay

## 2020-12-12 ENCOUNTER — Inpatient Hospital Stay (EMERGENCY_DEPARTMENT_HOSPITAL)
Admission: AD | Admit: 2020-12-12 | Discharge: 2020-12-12 | Disposition: A | Discharge status: Home / Self Care | Payer: 59 | Source: Home / Self Care | Attending: Obstetrics and Gynecology | Admitting: Obstetrics and Gynecology

## 2020-12-12 ENCOUNTER — Inpatient Hospital Stay (HOSPITAL_COMMUNITY)
Admission: AD | Admit: 2020-12-12 | Discharge: 2020-12-18 | DRG: 805 | Disposition: A | Discharge status: Home / Self Care | Payer: 59 | Attending: Obstetrics | Admitting: Obstetrics

## 2020-12-12 DIAGNOSIS — Z7982 Long term (current) use of aspirin: Secondary | ICD-10-CM | POA: Insufficient documentation

## 2020-12-12 DIAGNOSIS — R030 Elevated blood-pressure reading, without diagnosis of hypertension: Secondary | ICD-10-CM | POA: Diagnosis not present

## 2020-12-12 DIAGNOSIS — O2402 Pre-existing diabetes mellitus, type 1, in childbirth: Secondary | ICD-10-CM | POA: Diagnosis present

## 2020-12-12 DIAGNOSIS — O24919 Unspecified diabetes mellitus in pregnancy, unspecified trimester: Secondary | ICD-10-CM

## 2020-12-12 DIAGNOSIS — O99891 Other specified diseases and conditions complicating pregnancy: Secondary | ICD-10-CM

## 2020-12-12 DIAGNOSIS — R519 Headache, unspecified: Secondary | ICD-10-CM

## 2020-12-12 DIAGNOSIS — Z3A36 36 weeks gestation of pregnancy: Secondary | ICD-10-CM

## 2020-12-12 DIAGNOSIS — O1414 Severe pre-eclampsia complicating childbirth: Principal | ICD-10-CM | POA: Diagnosis present

## 2020-12-12 DIAGNOSIS — Z794 Long term (current) use of insulin: Secondary | ICD-10-CM

## 2020-12-12 DIAGNOSIS — O133 Gestational [pregnancy-induced] hypertension without significant proteinuria, third trimester: Secondary | ICD-10-CM | POA: Insufficient documentation

## 2020-12-12 DIAGNOSIS — O99214 Obesity complicating childbirth: Secondary | ICD-10-CM | POA: Insufficient documentation

## 2020-12-12 DIAGNOSIS — O98513 Other viral diseases complicating pregnancy, third trimester: Secondary | ICD-10-CM | POA: Insufficient documentation

## 2020-12-12 DIAGNOSIS — O1493 Unspecified pre-eclampsia, third trimester: Secondary | ICD-10-CM | POA: Diagnosis present

## 2020-12-12 DIAGNOSIS — E669 Obesity, unspecified: Secondary | ICD-10-CM | POA: Insufficient documentation

## 2020-12-12 DIAGNOSIS — O24113 Pre-existing diabetes mellitus, type 2, in pregnancy, third trimester: Secondary | ICD-10-CM | POA: Insufficient documentation

## 2020-12-12 DIAGNOSIS — E109 Type 1 diabetes mellitus without complications: Secondary | ICD-10-CM | POA: Diagnosis present

## 2020-12-12 DIAGNOSIS — U071 COVID-19: Secondary | ICD-10-CM

## 2020-12-12 DIAGNOSIS — E119 Type 2 diabetes mellitus without complications: Secondary | ICD-10-CM | POA: Insufficient documentation

## 2020-12-12 DIAGNOSIS — O9852 Other viral diseases complicating childbirth: Secondary | ICD-10-CM | POA: Diagnosis present

## 2020-12-12 LAB — URINALYSIS, ROUTINE W REFLEX MICROSCOPIC
Bacteria, UA: NONE SEEN
Bilirubin Urine: NEGATIVE
Glucose, UA: 150 mg/dL — AB
Hgb urine dipstick: NEGATIVE
Ketones, ur: NEGATIVE mg/dL
Leukocytes,Ua: NEGATIVE
Nitrite: NEGATIVE
Protein, ur: 100 mg/dL — AB
Specific Gravity, Urine: 1.016 (ref 1.005–1.030)
pH: 6 (ref 5.0–8.0)

## 2020-12-12 LAB — COMPREHENSIVE METABOLIC PANEL
ALT: 29 U/L (ref 0–44)
AST: 24 U/L (ref 15–41)
Albumin: 2.1 g/dL — ABNORMAL LOW (ref 3.5–5.0)
Alkaline Phosphatase: 141 U/L — ABNORMAL HIGH (ref 38–126)
Anion gap: 12 (ref 5–15)
BUN: 9 mg/dL (ref 6–20)
CO2: 18 mmol/L — ABNORMAL LOW (ref 22–32)
Calcium: 7.8 mg/dL — ABNORMAL LOW (ref 8.9–10.3)
Chloride: 107 mmol/L (ref 98–111)
Creatinine, Ser: 0.62 mg/dL (ref 0.44–1.00)
GFR, Estimated: >60 mL/min (ref >60)
Glucose, Bld: 178 mg/dL — ABNORMAL HIGH (ref 70–99)
Potassium: 3.8 mmol/L (ref 3.5–5.1)
Sodium: 137 mmol/L (ref 135–145)
Total Bilirubin: 0.4 mg/dL (ref 0.3–1.2)
Total Protein: 5.2 g/dL — ABNORMAL LOW (ref 6.5–8.1)

## 2020-12-12 LAB — CBC WITH DIFFERENTIAL/PLATELET
Abs Immature Granulocytes: 0.11 10*3/uL — ABNORMAL HIGH (ref 0.00–0.07)
Basophils Absolute: 0 10*3/uL (ref 0.0–0.1)
Basophils Relative: 0 %
Eosinophils Absolute: 0.2 10*3/uL (ref 0.0–0.5)
Eosinophils Relative: 3 %
HCT: 35.5 % — ABNORMAL LOW (ref 36.0–46.0)
Hemoglobin: 12 g/dL (ref 12.0–15.0)
Immature Granulocytes: 2 %
Lymphocytes Relative: 18 %
Lymphs Abs: 1.3 10*3/uL (ref 0.7–4.0)
MCH: 29.4 pg (ref 26.0–34.0)
MCHC: 33.8 g/dL (ref 30.0–36.0)
MCV: 87 fL (ref 80.0–100.0)
Monocytes Absolute: 0.5 10*3/uL (ref 0.1–1.0)
Monocytes Relative: 7 %
Neutro Abs: 5.2 10*3/uL (ref 1.7–7.7)
Neutrophils Relative %: 70 %
Platelets: 159 10*3/uL (ref 150–400)
RBC: 4.08 MIL/uL (ref 3.87–5.11)
RDW: 14.1 % (ref 11.5–15.5)
WBC: 7.4 10*3/uL (ref 4.0–10.5)
nRBC: 0 % (ref 0.0–0.2)

## 2020-12-12 LAB — PROTEIN / CREATININE RATIO, URINE
Creatinine, Urine: 125.35 mg/dL
Protein Creatinine Ratio: 0.71 mg/mg{Cre} — ABNORMAL HIGH (ref 0.00–0.15)
Total Protein, Urine: 89 mg/dL

## 2020-12-12 MED ORDER — CYCLOBENZAPRINE HCL 10 MG PO TABS: 10.0000 mg | ORAL_TABLET | Freq: Two times a day (BID) | ORAL | 0 refills | Status: AC | PRN

## 2020-12-12 MED ORDER — ACETAMINOPHEN 325 MG PO TABS
650.0000 mg | ORAL_TABLET | Freq: Once | ORAL | Status: AC
  Administered 2020-12-12: 650 mg via ORAL
  Filled 2020-12-12: qty 2

## 2020-12-12 MED ORDER — CYCLOBENZAPRINE HCL 5 MG PO TABS
10.0000 mg | ORAL_TABLET | Freq: Once | ORAL | Status: AC
  Administered 2020-12-12: 10 mg via ORAL
  Filled 2020-12-12: qty 2

## 2020-12-12 NOTE — Discharge Instructions (Signed)
10 Things You Can Do to Manage Your COVID-19 Symptoms at Home If you have possible or confirmed COVID-19: 1. Stay home from work and school. And stay away from other public places. If you must go out, avoid using any kind of public transportation, ridesharing, or taxis. 2. Monitor your symptoms carefully. If your symptoms get worse, call your healthcare provider immediately. 3. Get rest and stay hydrated. 4. If you have a medical appointment, call the healthcare provider ahead of time and tell them that you have or may have COVID-19. 5. For medical emergencies, call 911 and notify the dispatch personnel that you have or may have COVID-19. 6. Cover your cough and sneezes with a tissue or use the inside of your elbow. 7. Wash your hands often with soap and water for at least 20 seconds or clean your hands with an alcohol-based hand sanitizer that contains at least 60% alcohol. 8. As much as possible, stay in a specific room and away from other people in your home. Also, you should use a separate bathroom, if available. If you need to be around other people in or outside of the home, wear a mask. 9. Avoid sharing personal items with other people in your household, like dishes, towels, and bedding. 10. Clean all surfaces that are touched often, like counters, tabletops, and doorknobs. Use household cleaning sprays or wipes according to the label instructions. [employer] 06/05/2019 This information is not intended to replace advice given to you by your health care provider. Make sure you discuss any questions you have with your health care provider. Document Revised: 11/07/2019 Document Reviewed: 11/07/2019 Elsevier Patient Education  2020 Elsevier Inc.  Preeclampsia and Eclampsia Preeclampsia is a serious condition that may develop during pregnancy. This condition causes high blood pressure and increased protein in your urine along with other symptoms, such as headaches and vision changes.  These symptoms may develop as the condition gets worse. Preeclampsia may occur at 20 weeks of pregnancy or later. Diagnosing and treating preeclampsia early is very important. If not treated early, it can cause serious problems for you and your baby. One problem it can lead to is eclampsia. Eclampsia is a condition that causes muscle jerking or shaking (convulsions or seizures) and other serious problems for the mother. During pregnancy, delivering your baby may be the best treatment for preeclampsia or eclampsia. For most women, preeclampsia and eclampsia symptoms go away after giving birth. In rare cases, a woman may develop preeclampsia after giving birth (postpartum preeclampsia). This usually occurs within 48 hours after childbirth but may occur up to 6 weeks after giving birth. What are the causes? The cause of preeclampsia is not known. What increases the risk? The following risk factors make you more likely to develop preeclampsia:  Being pregnant for the first time.  Having had preeclampsia during a past pregnancy.  Having a family history of preeclampsia.  Having high blood pressure.  Being pregnant with more than one baby.  Being 31 or older.  Being African-American.  Having kidney disease or diabetes.  Having medical conditions such as lupus or blood diseases.  Being very overweight (obese). What are the signs or symptoms? The most common symptoms are:  Severe headaches.  Vision problems, such as blurred or double vision.  Abdominal pain, especially upper abdominal pain. Other symptoms that may develop as the condition gets worse include:  Sudden weight gain.  Sudden swelling of the hands, face, legs, and feet.  Severe nausea and vomiting.  Numbness in  the face, arms, legs, and feet.  Dizziness.  Urinating less than usual.  Slurred speech.  Convulsions or seizures. How is this diagnosed? There are no screening tests for preeclampsia. Your health care  provider will ask you about symptoms and check for signs of preeclampsia during your prenatal visits. You may also have tests that include:  Checking your blood pressure.  Urine tests to check for protein. Your health care provider will check for this at every prenatal visit.  Blood tests.  Monitoring your baby's heart rate.  Ultrasound. How is this treated? You and your health care provider will determine the treatment approach that is best for you. Treatment may include:  Having more frequent prenatal exams to check for signs of preeclampsia, if you have an increased risk for preeclampsia.  Medicine to lower your blood pressure.  Staying in the hospital, if your condition is severe. There, treatment will focus on controlling your blood pressure and the amount of fluids in your body (fluid retention).  Taking medicine (magnesium sulfate) to prevent seizures. This may be given as an injection or through an IV.  Taking a low-dose aspirin during your pregnancy.  Delivering your baby early. You may have your labor started with medicine (induced), or you may have a cesarean delivery. Follow these instructions at home: Eating and drinking   Drink enough fluid to keep your urine pale yellow.  Avoid caffeine. Lifestyle  Do not use any products that contain nicotine or tobacco, such as cigarettes and e-cigarettes. If you need help quitting, ask your health care provider.  Do not use alcohol or drugs.  Avoid stress as much as possible. Rest and get plenty of sleep. General instructions  Take over-the-counter and prescription medicines only as told by your health care provider.  When lying down, lie on your left side. This keeps pressure off your major blood vessels.  When sitting or lying down, raise (elevate) your feet. Try putting some pillows underneath your lower legs.  Exercise regularly. Ask your health care provider what kinds of exercise are best for you.  Keep all  follow-up and prenatal visits as told by your health care provider. This is important. How is this prevented? There is no known way of preventing preeclampsia or eclampsia from developing. However, to lower your risk of complications and detect problems early:  Get regular prenatal care. Your health care provider may be able to diagnose and treat the condition early.  Maintain a healthy weight. Ask your health care provider for help managing weight gain during pregnancy.  Work with your health care provider to manage any long-term (chronic) health conditions you have, such as diabetes or kidney problems.  You may have tests of your blood pressure and kidney function after giving birth.  Your health care provider may have you take low-dose aspirin during your next pregnancy. Contact a health care provider if:  You have symptoms that your health care provider told you may require more treatment or monitoring, such as: ? Headaches. ? Nausea or vomiting. ? Abdominal pain. ? Dizziness. ? Light-headedness. Get help right away if:  You have severe: ? Abdominal pain. ? Headaches that do not get better. ? Dizziness. ? Vision problems. ? Confusion. ? Nausea or vomiting.  You have any of the following: ? A seizure. ? Sudden, rapid weight gain. ? Sudden swelling in your hands, ankles, or face. ? Trouble moving any part of your body. ? Numbness in any part of your body. ? Trouble speaking. ?  Abnormal bleeding.  You faint. Summary  Preeclampsia is a serious condition that may develop during pregnancy.  This condition causes high blood pressure and increased protein in your urine along with other symptoms, such as headaches and vision changes.  Diagnosing and treating preeclampsia early is very important. If not treated early, it can cause serious problems for you and your baby.  Get help right away if you have symptoms that your health care provider told you to watch for. This  information is not intended to replace advice given to you by your health care provider. Make sure you discuss any questions you have with your health care provider. Document Revised: 07/24/2018 Document Reviewed: 06/27/2016 Elsevier Patient Education  2020 [employer].

## 2020-12-12 NOTE — MAU Provider Note (Signed)
History     CSN: [id number]  Arrival date and time: 12/12/20 2620   Event Date/Time   First Provider Initiated Contact with Patient 12/12/20 1113      Chief Complaint  Patient presents with  . Hypertension  . Headache  . Shortness of Breath   Sherry Clarke is a 25 y.o. G1P0 at [redacted]w[redacted]d who presents today with headache since last night and then this morning she took her blood pressure at home and it was elevated. BP at home today 154/85, 145/90 and 138/90. She denies any history of HTN prior to or during the pregnancy. She reports her headache is occipital and frontal. She rates her headache 7/10 at this time. She has not taken anything for her headache as of now. She denies any contractions, vaginal bleeding or LOF. She reports normal fetal movement.   Patient reports continued shortness of breath from when she was seen here a few days ago. She reports that it isn't worse, and possibly a little better from her last visit.   Hypertension This is a new problem. The current episode started today. Associated symptoms include headaches and shortness of breath. Associated agents: pregnancy  Risk factors for coronary artery disease include diabetes mellitus and obesity.  Headache  This is a new problem. The current episode started yesterday. The problem occurs constantly. The problem has been unchanged. The pain is located in the occipital and frontal region. The pain does not radiate. The pain is at a severity of 7/10. Pertinent negatives include no fever, nausea or vomiting. Nothing aggravates the symptoms. She has tried nothing for the symptoms. Her past medical history is significant for hypertension.  Shortness of Breath The current episode started in the past 7 days. The problem has been gradually improving. Associated symptoms include headaches. Pertinent negatives include no fever or vomiting.    OB History    Gravida  1   Para      Term      Preterm      AB      Living         SAB      IAB      Ectopic      Multiple      Live Births              Past Medical History:  Diagnosis Date  . Diabetes mellitus without complication [facility])     Past Surgical History:  Procedure Laterality Date  . WRIST FUSION Left 2020    Family History  Problem Relation Age of Onset  . Hypertension Mother   . Heart disease Mother     Social History   Tobacco Use  . Smoking status: Never Smoker  . Smokeless tobacco: Never Used  Vaping Use  . Vaping Use: Never used  Substance Use Topics  . Alcohol use: Never  . Drug use: Never    Allergies: No Known Allergies  Medications Prior to Admission  Medication Sig Dispense Refill Last Dose  . albuterol (VENTOLIN HFA) 108 (90 Base) MCG/ACT inhaler Inhale 2 puffs into the lungs every 6 (six) hours as needed for wheezing or shortness of breath. 8 g 2 12/12/2020 at 0800  . aspirin EC 81 MG tablet Take 81 mg by mouth daily. Swallow whole.   12/12/2020 at 0600  . fluticasone (FLONASE) 50 MCG/ACT nasal spray Place 1 spray into both nostrils in the morning and at bedtime for 14 days. 9.9 mL 2 12/12/2020 at 0800  .  insulin aspart (NOVOLOG) 100 UNIT/ML injection Inject 28 Units into the skin 4 (four) times daily.   12/12/2020 at 0730  . insulin glargine (LANTUS) 100 UNIT/ML injection Inject 45 Units into the skin at bedtime.   12/11/2020 at 2200  . Prenatal Vit-Fe Fumarate-FA (PRENATAL MULTIVITAMIN) TABS tablet Take 1 tablet by mouth daily at 12 noon.   12/11/2020 at 1300  . dicyclomine (BENTYL) 20 MG tablet Take 1 tablet (20 mg total) by mouth 2 (two) times daily. (Patient not taking: Reported on 11/26/2020) 20 tablet 0     Review of Systems  Constitutional: Negative for chills and fever.  HENT: Positive for congestion.   Respiratory: Positive for shortness of breath.   Gastrointestinal: Negative for nausea and vomiting.  Genitourinary: Negative for dysuria, pelvic pain, vaginal bleeding and vaginal discharge.  Neurological:  Positive for headaches.   Physical Exam   Blood pressure 128/83, pulse 100, temperature 97.9 F (36.6 C), temperature source Oral, resp. rate 19, height 5\' 9"  (1.753 m), weight (!) 143.5 kg, last menstrual period 04/02/2020, SpO2 97 %.  Physical Exam Vitals and nursing note reviewed.  Constitutional:      General: She is not in acute distress. HENT:     Head: Normocephalic.  Eyes:     Pupils: Pupils are equal, round, and reactive to light.  Cardiovascular:     Rate and Rhythm: Normal rate.  Pulmonary:     Effort: Pulmonary effort is normal.  Abdominal:     Palpations: Abdomen is soft.     Tenderness: There is no abdominal tenderness.  Skin:    General: Skin is warm and dry.  Neurological:     Mental Status: She is alert and oriented to person, place, and time.     Deep Tendon Reflexes: Reflexes normal.     Comments: No clonus   Psychiatric:        Mood and Affect: Mood normal.        Behavior: Behavior normal.      NST:  Baseline: 145 Variability: moderate Accels: 15x15 Decels: none Toco: none Reactive/Appropriate for GA   Results for orders placed or performed during the hospital encounter of 12/12/20 (from the past 24 hour(s))  Urinalysis, Routine w reflex microscopic Urine, Clean Catch     Status: Abnormal   Collection Time: 12/12/20 10:47 AM  Result Value Ref Range   Color, Urine YELLOW YELLOW   APPearance CLEAR CLEAR   Specific Gravity, Urine 1.016 1.005 - 1.030   pH 6.0 5.0 - 8.0   Glucose, UA 150 (A) NEGATIVE mg/dL   Hgb urine dipstick NEGATIVE NEGATIVE   Bilirubin Urine NEGATIVE NEGATIVE   Ketones, ur NEGATIVE NEGATIVE mg/dL   Protein, ur 02/09/21 (A) NEGATIVE mg/dL   Nitrite NEGATIVE NEGATIVE   Leukocytes,Ua NEGATIVE NEGATIVE   RBC / HPF 0-5 0 - 5 RBC/hpf   WBC, UA 0-5 0 - 5 WBC/hpf   Bacteria, UA NONE SEEN NONE SEEN   Squamous Epithelial / LPF 0-5 0 - 5   Mucus PRESENT   CBC with Differential/Platelet     Status: Abnormal   Collection Time:  12/12/20 11:35 AM  Result Value Ref Range   WBC 7.4 4.0 - 10.5 K/uL   RBC 4.08 3.87 - 5.11 MIL/uL   Hemoglobin 12.0 12.0 - 15.0 g/dL   HCT 02/09/21 (L) 62.9 - 52.8 %   MCV 87.0 80.0 - 100.0 fL   MCH 29.4 26.0 - 34.0 pg   MCHC 33.8 30.0 - 36.0  g/dL   RDW 72.5 36.6 - 44.0 %   Platelets 159 150 - 400 K/uL   nRBC 0.0 0.0 - 0.2 %   Neutrophils Relative % 70 %   Neutro Abs 5.2 1.7 - 7.7 K/uL   Lymphocytes Relative 18 %   Lymphs Abs 1.3 0.7 - 4.0 K/uL   Monocytes Relative 7 %   Monocytes Absolute 0.5 0.1 - 1.0 K/uL   Eosinophils Relative 3 %   Eosinophils Absolute 0.2 0.0 - 0.5 K/uL   Basophils Relative 0 %   Basophils Absolute 0.0 0.0 - 0.1 K/uL   Immature Granulocytes 2 %   Abs Immature Granulocytes 0.11 (H) 0.00 - 0.07 K/uL  Comprehensive metabolic panel     Status: Abnormal   Collection Time: 12/12/20 11:35 AM  Result Value Ref Range   Sodium 137 135 - 145 mmol/L   Potassium 3.8 3.5 - 5.1 mmol/L   Chloride 107 98 - 111 mmol/L   CO2 18 (L) 22 - 32 mmol/L   Glucose, Bld 178 (H) 70 - 99 mg/dL   BUN 9 6 - 20 mg/dL   Creatinine, Ser 3.47 0.44 - 1.00 mg/dL   Calcium 7.8 (L) 8.9 - 10.3 mg/dL   Total Protein 5.2 (L) 6.5 - 8.1 g/dL   Albumin 2.1 (L) 3.5 - 5.0 g/dL   AST 24 15 - 41 U/L   ALT 29 0 - 44 U/L   Alkaline Phosphatase 141 (H) 38 - 126 U/L   Total Bilirubin 0.4 0.3 - 1.2 mg/dL   GFR, Estimated >42 >59 mL/min   Anion gap 12 5 - 15   Patient Vitals for the past 24 hrs:  BP Temp Temp src Pulse Resp SpO2 Height Weight  12/12/20 1231 119/88 - - 98 - 99 % - -  12/12/20 1216 (!) 98/59 - - (!) 103 18 100 % - -  12/12/20 1100 128/83 - - 100 - 97 % - -  12/12/20 1043 123/89 97.9 F (36.6 C) Oral 99 19 98 % - -  12/12/20 1030 123/86 - - (!) 111 19 98 % - -  12/12/20 1015 135/82 98 F (36.7 C) Oral (!) 102 20 99 % - -  12/12/20 1012 - - - - - - 5\' 9"  (1.753 m) (!) 143.5 kg      MAU Course  Procedures  MDM  Patient has had flexeril and tylenol for headache and reports that  her headache has improved.   Patient has been normotensive while here.   1308: Dr. reviewed labs and patient history. Patient needs BP check next week. Will leave message with [facility] for patient to get BP check.   Assessment and Plan   1. [redacted] weeks gestation of pregnancy   2. COVID-19 virus infection   3. Acute nonintractable headache, unspecified headache type   4. Transient hypertension    DC home Comfort measures reviewed  3rd Trimester precautions  labor precautions  Fetal kick counts RX: flexeril PRN for headache Return to MAU as needed FU with OB as planned   Follow-up Information    Ob/Gyn, [facility] Follow up.   Why: Need to be seen in the office next week for a blood pressure check  Contact information: [address] [place] [phone]              [phone] DNP, CNM  12/12/20  1:18 PM

## 2020-12-12 NOTE — MAU Note (Signed)
..  Sherry Clarke is a 25 y.o. at [redacted]w[redacted]d here in MAU reporting: High blood pressure, headache, chest pain. Patient states she was seen in MAU earlier and since then her blood pressure has been higher. Her headache began yesterday and went away with medicine that they gave her. The chest pain began two hours ago and is sharp. Denies visual changes. +FM but a little bit less. Denies vaginal bleeding or leaking of fluid.  Pain score: head 5/10 chest 6/10 Vitals:   12/12/20 2338 12/12/20 2340  BP: (!) 150/99 (!) 147/102  Pulse: (!) 105   Resp: 17   Temp: 98.4 F (36.9 C)   SpO2: 100%      FHT: 145

## 2020-12-12 NOTE — MAU Note (Signed)
Sherry Clarke is a 25 y.o. at [redacted]w[redacted]d here in MAU reporting: headache since last night so this AM she checked her BP and it has been 150's/85. No visual changes. Is having some SOB ongoing since + covid test.  Onset of complaint: last night  Pain score: 7/10  Vitals:   12/12/20 1015  BP: 135/82  Pulse: (!) 102  Resp: 20  Temp: 98 F (36.7 C)  SpO2: 99%     FHT: +FM  Lab orders placed from triage: UA

## 2020-12-13 ENCOUNTER — Encounter (HOSPITAL_COMMUNITY): Payer: Self-pay | Admitting: Obstetrics and Gynecology

## 2020-12-13 ENCOUNTER — Other Ambulatory Visit: Payer: Self-pay

## 2020-12-13 DIAGNOSIS — O1493 Unspecified pre-eclampsia, third trimester: Secondary | ICD-10-CM | POA: Diagnosis present

## 2020-12-13 DIAGNOSIS — U071 COVID-19: Secondary | ICD-10-CM | POA: Diagnosis present

## 2020-12-13 DIAGNOSIS — O1414 Severe pre-eclampsia complicating childbirth: Secondary | ICD-10-CM | POA: Diagnosis present

## 2020-12-13 DIAGNOSIS — Z3A36 36 weeks gestation of pregnancy: Secondary | ICD-10-CM | POA: Diagnosis not present

## 2020-12-13 DIAGNOSIS — O9852 Other viral diseases complicating childbirth: Secondary | ICD-10-CM | POA: Diagnosis present

## 2020-12-13 DIAGNOSIS — R519 Headache, unspecified: Secondary | ICD-10-CM | POA: Diagnosis present

## 2020-12-13 DIAGNOSIS — E109 Type 1 diabetes mellitus without complications: Secondary | ICD-10-CM | POA: Diagnosis present

## 2020-12-13 DIAGNOSIS — Z794 Long term (current) use of insulin: Secondary | ICD-10-CM | POA: Diagnosis not present

## 2020-12-13 DIAGNOSIS — O2402 Pre-existing diabetes mellitus, type 1, in childbirth: Secondary | ICD-10-CM | POA: Diagnosis present

## 2020-12-13 LAB — CBC WITH DIFFERENTIAL/PLATELET
Abs Immature Granulocytes: 0.22 10*3/uL — ABNORMAL HIGH (ref 0.00–0.07)
Basophils Absolute: 0.1 10*3/uL (ref 0.0–0.1)
Basophils Relative: 1 %
Eosinophils Absolute: 0.3 10*3/uL (ref 0.0–0.5)
Eosinophils Relative: 4 %
HCT: 39.7 % (ref 36.0–46.0)
Hemoglobin: 12.6 g/dL (ref 12.0–15.0)
Immature Granulocytes: 2 %
Lymphocytes Relative: 25 %
Lymphs Abs: 2.3 10*3/uL (ref 0.7–4.0)
MCH: 28.4 pg (ref 26.0–34.0)
MCHC: 31.7 g/dL (ref 30.0–36.0)
MCV: 89.4 fL (ref 80.0–100.0)
Monocytes Absolute: 0.9 10*3/uL (ref 0.1–1.0)
Monocytes Relative: 10 %
Neutro Abs: 5.6 10*3/uL (ref 1.7–7.7)
Neutrophils Relative %: 58 %
Platelets: 191 10*3/uL (ref 150–400)
RBC: 4.44 MIL/uL (ref 3.87–5.11)
RDW: 14 % (ref 11.5–15.5)
WBC: 9.4 10*3/uL (ref 4.0–10.5)
nRBC: 0 % (ref 0.0–0.2)

## 2020-12-13 LAB — COMPREHENSIVE METABOLIC PANEL
ALT: 27 U/L (ref 0–44)
AST: 25 U/L (ref 15–41)
Albumin: 2.2 g/dL — ABNORMAL LOW (ref 3.5–5.0)
Alkaline Phosphatase: 140 U/L — ABNORMAL HIGH (ref 38–126)
Anion gap: 9 (ref 5–15)
BUN: 8 mg/dL (ref 6–20)
CO2: 17 mmol/L — ABNORMAL LOW (ref 22–32)
Calcium: 8.5 mg/dL — ABNORMAL LOW (ref 8.9–10.3)
Chloride: 112 mmol/L — ABNORMAL HIGH (ref 98–111)
Creatinine, Ser: 0.57 mg/dL (ref 0.44–1.00)
GFR, Estimated: >60 mL/min (ref >60)
Glucose, Bld: 63 mg/dL — ABNORMAL LOW (ref 70–99)
Potassium: 3.5 mmol/L (ref 3.5–5.1)
Sodium: 138 mmol/L (ref 135–145)
Total Bilirubin: 0.5 mg/dL (ref 0.3–1.2)
Total Protein: 5.6 g/dL — ABNORMAL LOW (ref 6.5–8.1)

## 2020-12-13 LAB — GLUCOSE, CAPILLARY
Glucose-Capillary: 49 mg/dL — ABNORMAL LOW (ref 70–99)
Glucose-Capillary: 96 mg/dL (ref 70–99)
Glucose-Capillary: 69 mg/dL — ABNORMAL LOW (ref 70–99)

## 2020-12-13 LAB — OB RESULTS CONSOLE HEPATITIS B SURFACE ANTIGEN: Hepatitis B Surface Ag: NEGATIVE

## 2020-12-13 LAB — OB RESULTS CONSOLE RUBELLA ANTIBODY, IGM: Rubella: IMMUNE

## 2020-12-13 LAB — OB RESULTS CONSOLE HIV ANTIBODY (ROUTINE TESTING): HIV: NONREACTIVE

## 2020-12-13 LAB — RPR: RPR Ser Ql: NONREACTIVE

## 2020-12-13 MED ORDER — INSULIN NPH (HUMAN) (ISOPHANE) 100 UNIT/ML ~~LOC~~ SUSP: 30.0000 [IU] | Freq: Two times a day (BID) | SUBCUTANEOUS | Status: DC

## 2020-12-13 MED ORDER — LABETALOL HCL 5 MG/ML IV SOLN
20.0000 mg | INTRAVENOUS | Status: DC | PRN
  Administered 2020-12-14 – 2020-12-15 (×2): 20 mg via INTRAVENOUS
  Filled 2020-12-13 (×2): qty 4

## 2020-12-13 MED ORDER — ACETAMINOPHEN 325 MG PO TABS
650.0000 mg | ORAL_TABLET | Freq: Once | ORAL | Status: AC
  Administered 2020-12-13: 650 mg via ORAL
  Filled 2020-12-13: qty 2

## 2020-12-13 MED ORDER — CYCLOBENZAPRINE HCL 5 MG PO TABS
10.0000 mg | ORAL_TABLET | Freq: Once | ORAL | Status: AC
  Administered 2020-12-13: 10 mg via ORAL
  Filled 2020-12-13: qty 2

## 2020-12-13 MED ORDER — LACTATED RINGERS IV SOLN: 500.0000 mL | INTRAVENOUS | Status: DC | PRN

## 2020-12-13 MED ORDER — OXYCODONE-ACETAMINOPHEN 5-325 MG PO TABS: 2.0000 | ORAL_TABLET | ORAL | Status: DC | PRN

## 2020-12-13 MED ORDER — HYDRALAZINE HCL 20 MG/ML IJ SOLN: 10.0000 mg | INTRAMUSCULAR | Status: DC | PRN

## 2020-12-13 MED ORDER — ONDANSETRON HCL 4 MG/2ML IJ SOLN
4.0000 mg | Freq: Four times a day (QID) | INTRAMUSCULAR | Status: DC | PRN
Start: 2020-12-13 — End: 2020-12-15
  Administered 2020-12-13 – 2020-12-15 (×4): 4 mg via INTRAVENOUS
  Filled 2020-12-13 (×5): qty 2

## 2020-12-13 MED ORDER — SOD CITRATE-CITRIC ACID 500-334 MG/5ML PO SOLN
30.0000 mL | ORAL | Status: DC | PRN
  Administered 2020-12-14: 30 mL via ORAL
  Filled 2020-12-13: qty 15

## 2020-12-13 MED ORDER — INSULIN REGULAR(HUMAN) IN NACL 100-0.9 UT/100ML-% IV SOLN
INTRAVENOUS | Status: DC
  Administered 2020-12-13: 09:00:00 2 [IU]/h via INTRAVENOUS
  Filled 2020-12-13 (×2): qty 100

## 2020-12-13 MED ORDER — TERBUTALINE SULFATE 1 MG/ML IJ SOLN: 0.2500 mg | Freq: Once | INTRAMUSCULAR | Status: DC | PRN

## 2020-12-13 MED ORDER — PHENYLEPHRINE 40 MCG/ML (10ML) SYRINGE FOR IV PUSH (FOR BLOOD PRESSURE SUPPORT)
80.0000 ug | PREFILLED_SYRINGE | INTRAVENOUS | Status: DC | PRN
  Filled 2020-12-13: qty 10

## 2020-12-13 MED ORDER — PHENYLEPHRINE 40 MCG/ML (10ML) SYRINGE FOR IV PUSH (FOR BLOOD PRESSURE SUPPORT): 80.0000 ug | PREFILLED_SYRINGE | INTRAVENOUS | Status: DC | PRN

## 2020-12-13 MED ORDER — LACTATED RINGERS IV SOLN: INTRAVENOUS | Status: DC

## 2020-12-13 MED ORDER — PENICILLIN G POT IN DEXTROSE 60000 UNIT/ML IV SOLN
3.0000 10*6.[IU] | INTRAVENOUS | Status: DC
  Administered 2020-12-13 – 2020-12-15 (×8): 3 10*6.[IU] via INTRAVENOUS
  Filled 2020-12-13 (×8): qty 50

## 2020-12-13 MED ORDER — FENTANYL-BUPIVACAINE-NACL 0.5-0.125-0.9 MG/250ML-% EP SOLN
12.0000 mL/h | EPIDURAL | Status: DC | PRN
  Administered 2020-12-15: 12 mL/h via EPIDURAL
  Filled 2020-12-13 (×2): qty 250

## 2020-12-13 MED ORDER — DEXTROSE 50 % IV SOLN: 0.0000 mL | INTRAVENOUS | Status: DC | PRN

## 2020-12-13 MED ORDER — OXYTOCIN BOLUS FROM INFUSION
333.0000 mL | Freq: Once | INTRAVENOUS | Status: AC
  Administered 2020-12-15: 333 mL via INTRAVENOUS

## 2020-12-13 MED ORDER — BUTORPHANOL TARTRATE 1 MG/ML IJ SOLN
1.0000 mg | INTRAMUSCULAR | Status: DC | PRN
  Administered 2020-12-13 (×2): 1 mg via INTRAVENOUS
  Filled 2020-12-13 (×2): qty 1

## 2020-12-13 MED ORDER — LABETALOL HCL 5 MG/ML IV SOLN: 40.0000 mg | INTRAVENOUS | Status: DC | PRN

## 2020-12-13 MED ORDER — EPHEDRINE 5 MG/ML INJ: 10.0000 mg | INTRAVENOUS | Status: DC | PRN

## 2020-12-13 MED ORDER — MISOPROSTOL 25 MCG QUARTER TABLET
25.0000 ug | ORAL_TABLET | ORAL | Status: DC | PRN
  Administered 2020-12-13 (×4): 25 ug via VAGINAL
  Filled 2020-12-13 (×4): qty 1

## 2020-12-13 MED ORDER — OXYCODONE-ACETAMINOPHEN 5-325 MG PO TABS
1.0000 | ORAL_TABLET | ORAL | Status: DC | PRN
  Administered 2020-12-13: 1 via ORAL
  Filled 2020-12-13: qty 1

## 2020-12-13 MED ORDER — LIDOCAINE HCL (PF) 1 % IJ SOLN
30.0000 mL | INTRAMUSCULAR | Status: DC | PRN
  Filled 2020-12-13: qty 30

## 2020-12-13 MED ORDER — DIPHENHYDRAMINE HCL 50 MG/ML IJ SOLN: 12.5000 mg | INTRAMUSCULAR | Status: DC | PRN

## 2020-12-13 MED ORDER — SODIUM CHLORIDE 0.9 % IV SOLN
5.0000 10*6.[IU] | Freq: Once | INTRAVENOUS | Status: DC
  Filled 2020-12-13: qty 5

## 2020-12-13 MED ORDER — OXYCODONE-ACETAMINOPHEN 7.5-325 MG PO TABS: 1.0000 | ORAL_TABLET | ORAL | Status: DC | PRN

## 2020-12-13 MED ORDER — OXYTOCIN-SODIUM CHLORIDE 30-0.9 UT/500ML-% IV SOLN
2.5000 [IU]/h | INTRAVENOUS | Status: DC
  Filled 2020-12-13: qty 500

## 2020-12-13 MED ORDER — LACTATED RINGERS IV SOLN: 500.0000 mL | Freq: Once | INTRAVENOUS | Status: DC

## 2020-12-13 MED ORDER — LABETALOL HCL 5 MG/ML IV SOLN: 80.0000 mg | INTRAVENOUS | Status: DC | PRN

## 2020-12-13 MED ORDER — ACETAMINOPHEN 325 MG PO TABS
650.0000 mg | ORAL_TABLET | ORAL | Status: DC | PRN
  Administered 2020-12-14: 650 mg via ORAL
  Filled 2020-12-13: qty 2

## 2020-12-13 MED ORDER — DEXTROSE IN LACTATED RINGERS 5 % IV SOLN
INTRAVENOUS | Status: DC
  Administered 2020-12-15: 125 mL/h via INTRAVENOUS

## 2020-12-13 NOTE — MAU Provider Note (Addendum)
History     CSN: [id number]  Arrival date and time: 12/12/20 2311   Event Date/Time   First Provider Initiated Contact with Patient 12/13/20 0125      Chief Complaint  Patient presents with  . Hypertension  . Headache   Sherry Clarke is a 25 y.o. G1P0 at [redacted]w[redacted]d who receives care at [facility].  She presents today for Hypertension and Headache.  She reports she took her blood pressure around 8pm and noted it was elevated.  She reports her headache also started around this time despite it being completely resolved upon discharge earlier today.  She rates her headache a 7/10 and is located in the frontal area and "in the back of my neck."  She describes the headache as "an ache."  She endorses fetal movement movement and denies contractions, but reports some "cramping and tightness" earlier today.  Patient reports feelings of blood sugar lowering and states she has Type 1 Diabetes. She reports eating pizza for dinner.    OB History     Gravida  1   Para      Term      Preterm      AB      Living         SAB      IAB      Ectopic      Multiple      Live Births              Past Medical History:  Diagnosis Date  . Diabetes mellitus without complication [facility])     Past Surgical History:  Procedure Laterality Date  . WRIST FUSION Left 2020    Family History  Problem Relation Age of Onset  . Hypertension Mother   . Heart disease Mother     Social History   Tobacco Use  . Smoking status: Never Smoker  . Smokeless tobacco: Never Used  Vaping Use  . Vaping Use: Never used  Substance Use Topics  . Alcohol use: Never  . Drug use: Never    Allergies: No Known Allergies  Medications Prior to Admission  Medication Sig Dispense Refill Last Dose  . albuterol (VENTOLIN HFA) 108 (90 Base) MCG/ACT inhaler Inhale 2 puffs into the lungs every 6 (six) hours as needed for wheezing or shortness of breath. 8 g 2 12/12/2020 at Unknown time  . aspirin EC 81 MG  tablet Take 81 mg by mouth daily. Swallow whole.   12/12/2020 at Unknown time  . cyclobenzaprine (FLEXERIL) 10 MG tablet Take 1 tablet (10 mg total) by mouth 2 (two) times daily as needed (tension headache). 20 tablet 0 12/12/2020 at Unknown time  . insulin aspart (NOVOLOG) 100 UNIT/ML injection Inject 28 Units into the skin 4 (four) times daily.   12/12/2020 at Unknown time  . insulin glargine (LANTUS) 100 UNIT/ML injection Inject 45 Units into the skin at bedtime.   12/12/2020 at Unknown time  . dicyclomine (BENTYL) 20 MG tablet Take 1 tablet (20 mg total) by mouth 2 (two) times daily. (Patient not taking: Reported on 11/26/2020) 20 tablet 0   . fluticasone (FLONASE) 50 MCG/ACT nasal spray Place 1 spray into both nostrils in the morning and at bedtime for 14 days. 9.9 mL 2   . Prenatal Vit-Fe Fumarate-FA (PRENATAL MULTIVITAMIN) TABS tablet Take 1 tablet by mouth daily at 12 noon.       Review of Systems  Constitutional: Negative for chills and fever.  Eyes: Negative  for visual disturbance.  Respiratory: Positive for cough. Negative for shortness of breath.   Gastrointestinal: Positive for nausea. Negative for abdominal pain and vomiting.  Genitourinary: Negative for difficulty urinating, dysuria, vaginal bleeding and vaginal discharge.  Neurological: Positive for headaches. Negative for dizziness and light-headedness.   Physical Exam   Blood pressure (!) 140/93, pulse (!) 104, temperature 98.4 F (36.9 C), temperature source Oral, resp. rate 17, height 5\' 9"  (1.753 m), last menstrual period 04/02/2020, SpO2 97 %. Vitals:   12/13/20 0015 12/13/20 0030 12/13/20 0045 12/13/20 0100  BP: (!) 149/99 (!) 140/92 (!) 138/94 (!) 140/93  Pulse: (!) 107 (!) 113 (!) 101 (!) 104  Resp:      Temp:      TempSrc:      SpO2: 99% 98% 98% 97%  Height:        Physical Exam Vitals reviewed.  Constitutional:      Appearance: She is well-developed. She is obese.  HENT:     Head: Normocephalic and atraumatic.   Eyes:     Pupils: Pupils are equal, round, and reactive to light.  Cardiovascular:     Rate and Rhythm: Regular rhythm. Tachycardia present.     Heart sounds: Normal heart sounds.  Pulmonary:     Effort: Pulmonary effort is normal. No respiratory distress.  Abdominal:     Palpations: Abdomen is soft.  Musculoskeletal:     Cervical back: Normal range of motion.  Skin:    General: Skin is warm and dry.  Neurological:     Mental Status: She is alert and oriented to person, place, and time.  Psychiatric:        Mood and Affect: Mood normal.        Speech: Speech normal.        Behavior: Behavior normal.     Fetal Assessment 145 bpm, Mod Var, -Decels, +Accels Toco: No ctx graphed  MAU Course   Results for orders placed or performed during the hospital encounter of 12/12/20 (from the past 24 hour(s))  Glucose, capillary     Status: Abnormal   Collection Time: 12/13/20  1:33 AM  Result Value Ref Range   Glucose-Capillary 49 (L) 70 - 99 mg/dL  CBC with Differential/Platelet     Status: Abnormal   Collection Time: 12/13/20  1:45 AM  Result Value Ref Range   WBC 9.4 4.0 - 10.5 K/uL   RBC 4.44 3.87 - 5.11 MIL/uL   Hemoglobin 12.6 12.0 - 15.0 g/dL   HCT 02/10/21 40.9 - 81.1 %   MCV 89.4 80.0 - 100.0 fL   MCH 28.4 26.0 - 34.0 pg   MCHC 31.7 30.0 - 36.0 g/dL   RDW 91.4 78.2 - 95.6 %   Platelets 191 150 - 400 K/uL   nRBC 0.0 0.0 - 0.2 %   Neutrophils Relative % 58 %   Neutro Abs 5.6 1.7 - 7.7 K/uL   Lymphocytes Relative 25 %   Lymphs Abs 2.3 0.7 - 4.0 K/uL   Monocytes Relative 10 %   Monocytes Absolute 0.9 0.1 - 1.0 K/uL   Eosinophils Relative 4 %   Eosinophils Absolute 0.3 0.0 - 0.5 K/uL   Basophils Relative 1 %   Basophils Absolute 0.1 0.0 - 0.1 K/uL   Immature Granulocytes 2 %   Abs Immature Granulocytes 0.22 (H) 0.00 - 0.07 K/uL  Comprehensive metabolic panel     Status: Abnormal   Collection Time: 12/13/20  1:45 AM  Result Value Ref Range  Sodium 138 135 - 145 mmol/L    Potassium 3.5 3.5 - 5.1 mmol/L   Chloride 112 (H) 98 - 111 mmol/L   CO2 17 (L) 22 - 32 mmol/L   Glucose, Bld 63 (L) 70 - 99 mg/dL   BUN 8 6 - 20 mg/dL   Creatinine, Ser 2.35 0.44 - 1.00 mg/dL   Calcium 8.5 (L) 8.9 - 10.3 mg/dL   Total Protein 5.6 (L) 6.5 - 8.1 g/dL   Albumin 2.2 (L) 3.5 - 5.0 g/dL   AST 25 15 - 41 U/L   ALT 27 0 - 44 U/L   Alkaline Phosphatase 140 (H) 38 - 126 U/L   Total Bilirubin 0.5 0.3 - 1.2 mg/dL   GFR, Estimated >36 >14 mL/min   Anion gap 9 5 - 15  Glucose, capillary     Status: None   Collection Time: 12/13/20  1:59 AM  Result Value Ref Range   Glucose-Capillary 96 70 - 99 mg/dL   No results found.  MDM PE Labs:CMP, CBC, BG EFM Pain Medication Assessment and Plan  26 year old G1P0  SIUP at 36.3weeks Cat I FT PreEclampsia w/ Severe Features  -POC Reviewed. -Exam performed and findings discussed. -Discussed treatment for headache with previous medications used.  Patient agreeable. -Labs ordered: CBC, CMP -BS collected at bedside and 49. Nurse instructed to give juice and repeat in 15 minutes. -Dr. Austin Miles consulted and informed of patient status, history, and interventions. Advised: *Call primary ob and recommend admission and induction for PreEclampsia with SF aeb persistent HA. -Dr. Fredia Sorrow called and informed of patient status, history, interventions, and recommendation. -Questions and concerns addressed and informed that primary nurse would call with further information, orders for admission, and cervical exam. -Care assumed by Dr. Lysle Morales MSN, CNM 12/13/2020, 1:25 AM    Reassessment (3:49 AM)  Nurse request [place] for verification of presentation. [place] on 12/23 confirms vertex presentation. Provider to bedside.  Patient informed that the ultrasound is considered a limited OB ultrasound and is not intended to be a complete ultrasound exam.  Patient also informed that the ultrasound is not being completed with the intent of  assessing for fetal or placental anomalies or any pelvic abnormalities.  Explained that the purpose of today's ultrasound is to assess for  presentation.  Patient acknowledges the purpose of the exam and the limitations of the study.  Cherre Robins, CNM 12/13/2020, 3:49 AM

## 2020-12-13 NOTE — Progress Notes (Incomplete)
Hypoglycemic Event  CBG: 69  Treatment: 4 oz juice/soda  Symptoms: None  Follow-up CBG: Time:*** CBG Result:***  Possible Reasons for Event: Inadequate meal intake  Comments/MD notified:Dr. Ross-needs to start gluco stabilizer, but waiting for MD to round.     Garald Braver

## 2020-12-13 NOTE — H&P (Signed)
Sherry Clarke is a 25 y.o. female presenting for headache  25 year old G1 P0 at 78 presents for the second time in the last 12 hours for evaluation headache and elevated blood pressures.  The patient was seen earlier in the day on 12/12/2020 for the same complaints.  At that time her blood pressures were normotensive.  She was complaining of a headache 7 out of 10.  PIH labs were all normal except urine protein creatinine was elevated at 0.7.  She was discharged home.  She returned to MAU due to headache unrelieved with Tylenol.  Her blood pressures were now in the mild hypertensive range.  She met criteria for preeclampsia with severe features based on elevated blood pressures, proteinuria, and unresolving headache and the decision was made to admit and proceed with delivery.  Pregnancy has been complicated by type 1 diabetes managed by endocrinology.  Her current medication regimen is Triseba 46U qhs, and NovoLog 23 units with breakfast and dinner.  She was COVID-positive on December 05, 2020. OB History    Gravida  1   Para      Term      Preterm      AB      Living        SAB      IAB      Ectopic      Multiple      Live Births             Past Medical History:  Diagnosis Date  . Diabetes mellitus without complication [facility])    Past Surgical History:  Procedure Laterality Date  . WRIST FUSION Left 2020   Family History: family history includes Heart disease in her mother; Hypertension in her mother. Social History:  reports that she has never smoked. She has never used smokeless tobacco. She reports that she does not drink alcohol and does not use drugs.     Maternal Diabetes: Yes:  Diabetes Type:  Pre-pregnancy Genetic Screening: Normal Maternal Ultrasounds/Referrals: Normal Fetal Ultrasounds or other Referrals:  None Maternal Substance Abuse:  No Significant Maternal Medications:  Meds include: Other:  Significant Maternal Lab Results:  None Other Comments:   None  Review of Systems History Dilation: Closed Effacement (%): Thick Station: -3 Exam by:: lee Blood pressure 139/88, pulse 87, temperature 98 F (36.7 C), temperature source Oral, resp. rate 16, height 5' 9"  (1.753 m), last menstrual period 04/02/2020, SpO2 98 %. Exam Physical Exam  Prenatal labs: ABO, Rh: --/--/O POS (01/09 3559) Antibody: NEG (01/09 0433) Rubella:  Imm RPR: NON REACTIVE (01/09 0433)  HBsAg:   Neg HIV:   NR GBS:   Unknown  Assessment/Plan: 1) Admit 2) Labetalol per protocol 3) Continue basal insulin and use CBGs per diabetic pregnant protocol 4) SCDs 5) PCN for unknown GBS 6) If BPs become severe range will add magnesium sulfate 7) Misoprostal 12mg PV Q 4 8) Epidural on request    KVanessa Kick1/08/2021, 12:56 PM

## 2020-12-14 ENCOUNTER — Inpatient Hospital Stay (HOSPITAL_COMMUNITY): Payer: 59 | Admitting: Anesthesiology

## 2020-12-14 ENCOUNTER — Encounter (HOSPITAL_COMMUNITY): Payer: Self-pay | Admitting: Obstetrics and Gynecology

## 2020-12-14 LAB — GLUCOSE, CAPILLARY
Glucose-Capillary: 106 mg/dL — ABNORMAL HIGH (ref 70–99)
Glucose-Capillary: 135 mg/dL — ABNORMAL HIGH (ref 70–99)
Glucose-Capillary: 97 mg/dL (ref 70–99)
Glucose-Capillary: 108 mg/dL — ABNORMAL HIGH (ref 70–99)
Glucose-Capillary: 109 mg/dL — ABNORMAL HIGH (ref 70–99)
Glucose-Capillary: 113 mg/dL — ABNORMAL HIGH (ref 70–99)
Glucose-Capillary: 120 mg/dL — ABNORMAL HIGH (ref 70–99)
Glucose-Capillary: 126 mg/dL — ABNORMAL HIGH (ref 70–99)
Glucose-Capillary: 127 mg/dL — ABNORMAL HIGH (ref 70–99)
Glucose-Capillary: 128 mg/dL — ABNORMAL HIGH (ref 70–99)
Glucose-Capillary: 129 mg/dL — ABNORMAL HIGH (ref 70–99)
Glucose-Capillary: 131 mg/dL — ABNORMAL HIGH (ref 70–99)
Glucose-Capillary: 176 mg/dL — ABNORMAL HIGH (ref 70–99)
Glucose-Capillary: 177 mg/dL — ABNORMAL HIGH (ref 70–99)
Glucose-Capillary: 63 mg/dL — ABNORMAL LOW (ref 70–99)
Glucose-Capillary: 65 mg/dL — ABNORMAL LOW (ref 70–99)
Glucose-Capillary: 75 mg/dL (ref 70–99)
Glucose-Capillary: 93 mg/dL (ref 70–99)
Glucose-Capillary: 93 mg/dL (ref 70–99)
Glucose-Capillary: 95 mg/dL (ref 70–99)
Glucose-Capillary: 97 mg/dL (ref 70–99)
Glucose-Capillary: 97 mg/dL (ref 70–99)
Glucose-Capillary: 115 mg/dL — ABNORMAL HIGH (ref 70–99)
Glucose-Capillary: 111 mg/dL — ABNORMAL HIGH (ref 70–99)
Glucose-Capillary: 111 mg/dL — ABNORMAL HIGH (ref 70–99)
Glucose-Capillary: 126 mg/dL — ABNORMAL HIGH (ref 70–99)
Glucose-Capillary: 131 mg/dL — ABNORMAL HIGH (ref 70–99)
Glucose-Capillary: 129 mg/dL — ABNORMAL HIGH (ref 70–99)

## 2020-12-14 LAB — COMPREHENSIVE METABOLIC PANEL
ALT: 31 U/L (ref 0–44)
AST: 36 U/L (ref 15–41)
Albumin: 2.5 g/dL — ABNORMAL LOW (ref 3.5–5.0)
Alkaline Phosphatase: 168 U/L — ABNORMAL HIGH (ref 38–126)
Anion gap: 11 (ref 5–15)
BUN: 6 mg/dL (ref 6–20)
CO2: 18 mmol/L — ABNORMAL LOW (ref 22–32)
Calcium: 8.7 mg/dL — ABNORMAL LOW (ref 8.9–10.3)
Chloride: 106 mmol/L (ref 98–111)
Creatinine, Ser: 0.61 mg/dL (ref 0.44–1.00)
GFR, Estimated: >60 mL/min (ref >60)
Glucose, Bld: 116 mg/dL — ABNORMAL HIGH (ref 70–99)
Potassium: 4 mmol/L (ref 3.5–5.1)
Sodium: 135 mmol/L (ref 135–145)
Total Bilirubin: 1.1 mg/dL (ref 0.3–1.2)
Total Protein: 6.3 g/dL — ABNORMAL LOW (ref 6.5–8.1)

## 2020-12-14 LAB — CBC
HCT: 41.2 % (ref 36.0–46.0)
Hemoglobin: 13.5 g/dL (ref 12.0–15.0)
MCH: 28.5 pg (ref 26.0–34.0)
MCHC: 32.8 g/dL (ref 30.0–36.0)
MCV: 86.9 fL (ref 80.0–100.0)
Platelets: 231 10*3/uL (ref 150–400)
RBC: 4.74 MIL/uL (ref 3.87–5.11)
RDW: 13.9 % (ref 11.5–15.5)
WBC: 7.4 10*3/uL (ref 4.0–10.5)
nRBC: 0 % (ref 0.0–0.2)

## 2020-12-14 LAB — TYPE AND SCREEN
ABO/RH(D): O POS
ABO/RH(D): O POS
Antibody Screen: NEGATIVE
Antibody Screen: NEGATIVE

## 2020-12-14 MED ORDER — PROMETHAZINE HCL 25 MG/ML IJ SOLN
12.5000 mg | INTRAMUSCULAR | Status: DC | PRN
  Administered 2020-12-14 (×2): 12.5 mg via INTRAVENOUS
  Filled 2020-12-14 (×2): qty 1

## 2020-12-14 MED ORDER — FAMOTIDINE IN NACL 20-0.9 MG/50ML-% IV SOLN
20.0000 mg | Freq: Two times a day (BID) | INTRAVENOUS | Status: DC
  Administered 2020-12-14 (×2): 20 mg via INTRAVENOUS
  Filled 2020-12-14 (×2): qty 50

## 2020-12-14 MED ORDER — MAGNESIUM SULFATE 40 GM/1000ML IV SOLN
INTRAVENOUS | Status: AC
  Filled 2020-12-14: qty 1000

## 2020-12-14 MED ORDER — MAGNESIUM SULFATE 40 GM/1000ML IV SOLN: 2.0000 g/h | INTRAVENOUS | Status: DC

## 2020-12-14 MED ORDER — SODIUM CHLORIDE (PF) 0.9 % IJ SOLN
INTRAMUSCULAR | Status: DC | PRN
  Administered 2020-12-14: 12 mL/h via EPIDURAL

## 2020-12-14 MED ORDER — METOCLOPRAMIDE HCL 10 MG PO TABS
10.0000 mg | ORAL_TABLET | Freq: Three times a day (TID) | ORAL | Status: DC | PRN
  Administered 2020-12-14: 10 mg via ORAL
  Filled 2020-12-14 (×2): qty 1

## 2020-12-14 MED ORDER — LIDOCAINE HCL (PF) 1 % IJ SOLN
INTRAMUSCULAR | Status: DC | PRN
  Administered 2020-12-14: 5 mL via EPIDURAL
  Administered 2020-12-14: 4 mL via EPIDURAL

## 2020-12-14 MED ORDER — FENTANYL CITRATE (PF) 100 MCG/2ML IJ SOLN
50.0000 ug | INTRAMUSCULAR | Status: DC | PRN
Start: 2020-12-14 — End: 2020-12-15
  Administered 2020-12-14: 100 ug via INTRAVENOUS
  Filled 2020-12-14: qty 2

## 2020-12-14 MED ORDER — OXYTOCIN-SODIUM CHLORIDE 30-0.9 UT/500ML-% IV SOLN
1.0000 m[IU]/min | INTRAVENOUS | Status: DC
  Administered 2020-12-14: 2 m[IU]/min via INTRAVENOUS
  Filled 2020-12-14: qty 500

## 2020-12-14 MED ORDER — MAGNESIUM SULFATE BOLUS VIA INFUSION
4.0000 g | Freq: Once | INTRAVENOUS | Status: AC
  Administered 2020-12-14: 4 g via INTRAVENOUS
  Filled 2020-12-14: qty 1000

## 2020-12-14 MED ORDER — MISOPROSTOL 50MCG HALF TABLET
50.0000 ug | ORAL_TABLET | ORAL | Status: AC
  Administered 2020-12-14 (×2): 50 ug via ORAL
  Filled 2020-12-14 (×2): qty 1

## 2020-12-14 NOTE — Anesthesia Procedure Notes (Signed)

## 2020-12-14 NOTE — Anesthesia Preprocedure Evaluation (Signed)
Anesthesia Evaluation  Patient identified by MRN, date of birth, ID band Patient awake    Reviewed: Allergy & Precautions, NPO status , Patient's Chart, lab work & pertinent test results, reviewed documented beta blocker date and time   Airway Mallampati: II  TM Distance: >3 FB Neck ROM: Full    Dental no notable dental hx. (+) Teeth Intact   Pulmonary  Covid +, symptomatic- cough   Pulmonary exam normal breath sounds clear to auscultation       Cardiovascular hypertension, Pt. on medications and Pt. on home beta blockers Normal cardiovascular exam Rhythm:Regular Rate:Normal     Neuro/Psych negative neurological ROS  negative psych ROS   GI/Hepatic Neg liver ROS, GERD  ,  Endo/Other  diabetes, Well Controlled, Type 1, Insulin DependentMorbid obesity  Renal/GU negative Renal ROS  negative genitourinary   Musculoskeletal negative musculoskeletal ROS (+)   Abdominal (+) + obese,   Peds  Hematology  (+) anemia ,   Anesthesia Other Findings   Reproductive/Obstetrics (+) Pregnancy                             Anesthesia Physical Anesthesia Plan  ASA: III  Anesthesia Plan: Epidural   Post-op Pain Management:    Induction:   PONV Risk Score and Plan:   Airway Management Planned: Natural Airway  Additional Equipment:   Intra-op Plan:   Post-operative Plan:   Informed Consent: I have reviewed the patients History and Physical, chart, labs and discussed the procedure including the risks, benefits and alternatives for the proposed anesthesia with the patient or authorized representative who has indicated his/her understanding and acceptance.       Plan Discussed with: Anesthesiologist  Anesthesia Plan Comments:         Anesthesia Quick Evaluation

## 2020-12-14 NOTE — Progress Notes (Signed)
Called to see and examine patient.  Foley bulb out around 1030.  Patient was uncomfortable so received epidural with plan to start pitocin.  Following epidural placement, recurrent late decelerations occurred.  She received an IV fluid bolus, one dose of phenylephrine for relative hypotension, and multiple positions changes.  Pitocin was never started  Upon my arrival:   BP (!) 139/98   Pulse 81   Temp (!) 97.4 F (36.3 C) (Oral)   Resp 18   Ht 5\' 9"  (1.753 m)   LMP 04/02/2020   SpO2 96%   BMI 46.71 kg/m   Toco:  Irregular q3-6 minutes EFM:  Currently 150s, moderate variability, resolution of late deceleration.  + scalp stimulation with exam. SVE: 4/70/-3  Plan:  Continue to monitor EFM x additional 20 minutes.  If remains category 1, will start pitocin for continuation of induction of labor for severe preeclampsia.  Discussed in detail with patient and her mom that if recurrent late decelerations unresponsive to resuscitative measures would be in indication for primary cesarean delivery --continue magnesium sulfate --continue pcn for gbs unknown --SCDs

## 2020-12-14 NOTE — Progress Notes (Signed)
Inpatient Diabetes Program Recommendations  AACE/ADA: New Consensus Statement on Inpatient Glycemic Control (2015)  Target Ranges:  Prepandial:   less than 140 mg/dL      Peak postprandial:   less than 180 mg/dL (1-2 hours)      Critically ill patients:  140 - 180 mg/dL   Lab Results  Component Value Date   GLUCAP 109 (H) 12/14/2020    Review of Glycemic Control Results for MONNA, CREAN (MRN [id number]) as of 12/14/2020 13:05  Ref. Range 12/14/2020 04:35 12/14/2020 06:34 12/14/2020 07:32 12/14/2020 11:18 12/14/2020 12:07  Glucose-Capillary Latest Ref Range: 70 - 99 mg/dL 841 (H) 660 (H) 630 (H) 108 (H) 109 (H)   Diabetes history:  T1DM Outpatient Diabetes medications:  Pre-pregnancy-Tresiba  21 units QHS, Novolog 10 units TID During pregnancy-Tresiba 46 units QHS, Novolog 23 units BID with breakfast and supper Current orders for Inpatient glycemic control:  IV Insulin  Inpatient Diabetes Program Recommendations:    Spoke with patient on the phone; she verified above medications.  When patient transitions to SQ insulin at delivery, please consider 15 units Lantus at delivery.  Continue to infuse IV insulin 2 hrs after Lantus administration.  Also consider Novolog 0-9 units TID and Novolog 3 units TID with meals if eats at least 50%.    Will continue to follow while inpatient.  Thank you, Dulce Sellar, RN, BSN Diabetes Coordinator Inpatient Diabetes Program [phone] (team pager from 8a-5p)

## 2020-12-14 NOTE — Progress Notes (Addendum)
Patient seen and examined, history reviewed.  G1 @ [redacted]w[redacted]d dmitted yesterday for preeclampsia, severe by headache.  She received 6 doses of cytotec overnight,  last dose at 0623.  She reports an intermittent headache partially relieved by tylenol.  Overnight, BPs have persistently been mild range 130-150s / 80-100s.    BP (!) 135/94   Pulse 86   Temp (!) 97.4 F (36.3 C) (Oral)   Resp 18   Ht 5\' 9"  (1.753 m)   LMP 04/02/2020   SpO2 98%   BMI 46.71 kg/m   NAD, tired Abdomen: soft, gravid SVE; 1/50/-3 / soft / posterior.  Foley bulb placed and filled with 60 mL fluid Toco: uterine irritability EFM: 140s, moderate variability, category 1 Extremities:  Symmetric, 2+ edema to knees bilaterally   BSUS (limited) confirms cephalic presentation  A/P :  1. IOL: prolonged latent labor.  Foley bulb placed.  Will transition to low dose pitocin after this last dose of cytotec at 1023.  Epidural upon maternal request 2. Preeclampsia, severe by symptoms.  Given diagnosis of severe preeclampsia, magnesium sulfate prophylaxis is warranted.  Will start now.  Repeat cbc / cmp.  3. Type 1 DM: on inuslin gtt / EndoTool 4. Prophylaxis:  Will order SCDs

## 2020-12-14 NOTE — Progress Notes (Signed)
Patient resting.  Since last visit, fetal heart tracing has remained category 1.  Pitocin was started and is at 39mU  Toco: q3-4 minutes EFM: 140s, moderate variability, no decelerations, category 1 SVE: 5/50-2, AROM copious clear fluid  A/P:  Continue pitocin

## 2020-12-15 ENCOUNTER — Encounter (HOSPITAL_COMMUNITY): Payer: Self-pay | Admitting: Obstetrics and Gynecology

## 2020-12-15 LAB — GLUCOSE, CAPILLARY
Glucose-Capillary: 111 mg/dL — ABNORMAL HIGH (ref 70–99)
Glucose-Capillary: 124 mg/dL — ABNORMAL HIGH (ref 70–99)
Glucose-Capillary: 130 mg/dL — ABNORMAL HIGH (ref 70–99)
Glucose-Capillary: 131 mg/dL — ABNORMAL HIGH (ref 70–99)
Glucose-Capillary: 132 mg/dL — ABNORMAL HIGH (ref 70–99)
Glucose-Capillary: 137 mg/dL — ABNORMAL HIGH (ref 70–99)
Glucose-Capillary: 152 mg/dL — ABNORMAL HIGH (ref 70–99)
Glucose-Capillary: 157 mg/dL — ABNORMAL HIGH (ref 70–99)
Glucose-Capillary: 170 mg/dL — ABNORMAL HIGH (ref 70–99)
Glucose-Capillary: 178 mg/dL — ABNORMAL HIGH (ref 70–99)
Glucose-Capillary: 195 mg/dL — ABNORMAL HIGH (ref 70–99)
Glucose-Capillary: 208 mg/dL — ABNORMAL HIGH (ref 70–99)
Glucose-Capillary: 213 mg/dL — ABNORMAL HIGH (ref 70–99)
Glucose-Capillary: 253 mg/dL — ABNORMAL HIGH (ref 70–99)
Glucose-Capillary: 260 mg/dL — ABNORMAL HIGH (ref 70–99)

## 2020-12-15 LAB — MAGNESIUM
Magnesium: 7.3 mg/dL (ref 1.7–2.4)
Magnesium: 6 mg/dL — ABNORMAL HIGH (ref 1.7–2.4)
Magnesium: 3.6 mg/dL — ABNORMAL HIGH (ref 1.7–2.4)

## 2020-12-15 LAB — CBC
HCT: 40.1 % (ref 36.0–46.0)
HCT: 37.6 % (ref 36.0–46.0)
Hemoglobin: 12.8 g/dL (ref 12.0–15.0)
Hemoglobin: 12.6 g/dL (ref 12.0–15.0)
MCH: 28.2 pg (ref 26.0–34.0)
MCH: 29.2 pg (ref 26.0–34.0)
MCHC: 31.9 g/dL (ref 30.0–36.0)
MCHC: 33.5 g/dL (ref 30.0–36.0)
MCV: 88.3 fL (ref 80.0–100.0)
MCV: 87 fL (ref 80.0–100.0)
Platelets: 246 10*3/uL (ref 150–400)
Platelets: 245 10*3/uL (ref 150–400)
RBC: 4.54 MIL/uL (ref 3.87–5.11)
RBC: 4.32 MIL/uL (ref 3.87–5.11)
RDW: 14 % (ref 11.5–15.5)
RDW: 14 % (ref 11.5–15.5)
WBC: 11.5 10*3/uL — ABNORMAL HIGH (ref 4.0–10.5)
WBC: 12.9 10*3/uL — ABNORMAL HIGH (ref 4.0–10.5)
nRBC: 0 % (ref 0.0–0.2)
nRBC: 0 % (ref 0.0–0.2)

## 2020-12-15 LAB — COMPREHENSIVE METABOLIC PANEL
ALT: 31 U/L (ref 0–44)
ALT: 27 U/L (ref 0–44)
ALT: 24 U/L (ref 0–44)
AST: 33 U/L (ref 15–41)
AST: 34 U/L (ref 15–41)
AST: 28 U/L (ref 15–41)
Albumin: 2.2 g/dL — ABNORMAL LOW (ref 3.5–5.0)
Albumin: 1.9 g/dL — ABNORMAL LOW (ref 3.5–5.0)
Albumin: 1.8 g/dL — ABNORMAL LOW (ref 3.5–5.0)
Alkaline Phosphatase: 169 U/L — ABNORMAL HIGH (ref 38–126)
Alkaline Phosphatase: 158 U/L — ABNORMAL HIGH (ref 38–126)
Alkaline Phosphatase: 139 U/L — ABNORMAL HIGH (ref 38–126)
Anion gap: 13 (ref 5–15)
Anion gap: 13 (ref 5–15)
Anion gap: 10 (ref 5–15)
BUN: 11 mg/dL (ref 6–20)
BUN: 13 mg/dL (ref 6–20)
BUN: 11 mg/dL (ref 6–20)
CO2: 17 mmol/L — ABNORMAL LOW (ref 22–32)
CO2: 17 mmol/L — ABNORMAL LOW (ref 22–32)
CO2: 19 mmol/L — ABNORMAL LOW (ref 22–32)
Calcium: 8.1 mg/dL — ABNORMAL LOW (ref 8.9–10.3)
Calcium: 8.1 mg/dL — ABNORMAL LOW (ref 8.9–10.3)
Calcium: 7.7 mg/dL — ABNORMAL LOW (ref 8.9–10.3)
Chloride: 103 mmol/L (ref 98–111)
Chloride: 104 mmol/L (ref 98–111)
Chloride: 103 mmol/L (ref 98–111)
Creatinine, Ser: 1.39 mg/dL — ABNORMAL HIGH (ref 0.44–1.00)
Creatinine, Ser: 1.71 mg/dL — ABNORMAL HIGH (ref 0.44–1.00)
Creatinine, Ser: 1.28 mg/dL — ABNORMAL HIGH (ref 0.44–1.00)
GFR, Estimated: 54 mL/min — ABNORMAL LOW (ref >60)
GFR, Estimated: 42 mL/min — ABNORMAL LOW (ref >60)
GFR, Estimated: 60 mL/min — ABNORMAL LOW (ref >60)
Glucose, Bld: 140 mg/dL — ABNORMAL HIGH (ref 70–99)
Glucose, Bld: 175 mg/dL — ABNORMAL HIGH (ref 70–99)
Glucose, Bld: 278 mg/dL — ABNORMAL HIGH (ref 70–99)
Potassium: 3.7 mmol/L (ref 3.5–5.1)
Potassium: 3.7 mmol/L (ref 3.5–5.1)
Potassium: 3.8 mmol/L (ref 3.5–5.1)
Sodium: 133 mmol/L — ABNORMAL LOW (ref 135–145)
Sodium: 134 mmol/L — ABNORMAL LOW (ref 135–145)
Sodium: 132 mmol/L — ABNORMAL LOW (ref 135–145)
Total Bilirubin: 0.8 mg/dL (ref 0.3–1.2)
Total Bilirubin: 0.9 mg/dL (ref 0.3–1.2)
Total Bilirubin: 1 mg/dL (ref 0.3–1.2)
Total Protein: 5.6 g/dL — ABNORMAL LOW (ref 6.5–8.1)
Total Protein: 5 g/dL — ABNORMAL LOW (ref 6.5–8.1)
Total Protein: 4.7 g/dL — ABNORMAL LOW (ref 6.5–8.1)

## 2020-12-15 MED ORDER — MAGNESIUM SULFATE 40 GM/1000ML IV SOLN
1.0000 g/h | INTRAVENOUS | Status: DC
  Administered 2020-12-15: 1 g/h via INTRAVENOUS
  Filled 2020-12-15: qty 1000

## 2020-12-15 MED ORDER — SENNOSIDES-DOCUSATE SODIUM 8.6-50 MG PO TABS
2.0000 | ORAL_TABLET | ORAL | Status: DC
  Administered 2020-12-15 – 2020-12-16 (×2): 2 via ORAL
  Filled 2020-12-15 (×2): qty 2

## 2020-12-15 MED ORDER — TRANEXAMIC ACID-NACL 1000-0.7 MG/100ML-% IV SOLN: 1000.0000 mg | INTRAVENOUS | Status: AC

## 2020-12-15 MED ORDER — MISOPROSTOL 200 MCG PO TABS
ORAL_TABLET | ORAL | Status: AC
  Filled 2020-12-15: qty 5

## 2020-12-15 MED ORDER — BENZOCAINE-MENTHOL 20-0.5 % EX AERO
1.0000 "application " | INHALATION_SPRAY | CUTANEOUS | Status: DC | PRN
  Administered 2020-12-16: 1 via TOPICAL
  Filled 2020-12-15: qty 56

## 2020-12-15 MED ORDER — COCONUT OIL OIL: 1.0000 "application " | TOPICAL_OIL | Status: DC | PRN

## 2020-12-15 MED ORDER — ONDANSETRON HCL 4 MG/2ML IJ SOLN: 4.0000 mg | INTRAMUSCULAR | Status: DC | PRN

## 2020-12-15 MED ORDER — INSULIN ASPART 100 UNIT/ML ~~LOC~~ SOLN
8.0000 [IU] | Freq: Three times a day (TID) | SUBCUTANEOUS | Status: DC
  Administered 2020-12-15 – 2020-12-17 (×5): 8 [IU] via SUBCUTANEOUS

## 2020-12-15 MED ORDER — WITCH HAZEL-GLYCERIN EX PADS: 1.0000 "application " | MEDICATED_PAD | CUTANEOUS | Status: DC | PRN

## 2020-12-15 MED ORDER — SIMETHICONE 80 MG PO CHEW
80.0000 mg | CHEWABLE_TABLET | ORAL | Status: DC | PRN
Start: 2020-12-15 — End: 2020-12-18
  Administered 2020-12-16 (×2): 80 mg via ORAL
  Filled 2020-12-15 (×2): qty 1

## 2020-12-15 MED ORDER — INSULIN GLARGINE 100 UNIT/ML ~~LOC~~ SOLN
15.0000 [IU] | Freq: Every day | SUBCUTANEOUS | Status: DC
  Administered 2020-12-15 – 2020-12-16 (×3): 15 [IU] via SUBCUTANEOUS
  Filled 2020-12-15 (×5): qty 0.15

## 2020-12-15 MED ORDER — TETANUS-DIPHTH-ACELL PERTUSSIS 5-2.5-18.5 LF-MCG/0.5 IM SUSY: 0.5000 mL | PREFILLED_SYRINGE | Freq: Once | INTRAMUSCULAR | Status: DC

## 2020-12-15 MED ORDER — LACTATED RINGERS IV SOLN: INTRAVENOUS | Status: DC

## 2020-12-15 MED ORDER — INSULIN ASPART 100 UNIT/ML ~~LOC~~ SOLN
3.0000 [IU] | Freq: Three times a day (TID) | SUBCUTANEOUS | Status: DC
  Administered 2020-12-15: 3 [IU] via SUBCUTANEOUS

## 2020-12-15 MED ORDER — ONDANSETRON HCL 4 MG PO TABS: 4.0000 mg | ORAL_TABLET | ORAL | Status: DC | PRN

## 2020-12-15 MED ORDER — INSULIN ASPART 100 UNIT/ML ~~LOC~~ SOLN
0.0000 [IU] | Freq: Three times a day (TID) | SUBCUTANEOUS | Status: DC
  Administered 2020-12-15: 5 [IU] via SUBCUTANEOUS
  Administered 2020-12-15 (×2): 8 [IU] via SUBCUTANEOUS
  Administered 2020-12-15: 2 [IU] via SUBCUTANEOUS

## 2020-12-15 MED ORDER — MISOPROSTOL 200 MCG PO TABS
1000.0000 ug | ORAL_TABLET | Freq: Once | ORAL | Status: AC
  Administered 2020-12-15: 1000 ug via RECTAL

## 2020-12-15 MED ORDER — OXYCODONE HCL 5 MG PO TABS
5.0000 mg | ORAL_TABLET | ORAL | Status: DC | PRN
  Filled 2020-12-15: qty 1

## 2020-12-15 MED ORDER — OXYCODONE HCL 5 MG PO TABS: 10.0000 mg | ORAL_TABLET | ORAL | Status: DC | PRN

## 2020-12-15 MED ORDER — DIPHENHYDRAMINE HCL 25 MG PO CAPS: 25.0000 mg | ORAL_CAPSULE | Freq: Four times a day (QID) | ORAL | Status: DC | PRN

## 2020-12-15 MED ORDER — NIFEDIPINE ER OSMOTIC RELEASE 30 MG PO TB24
30.0000 mg | ORAL_TABLET | Freq: Every day | ORAL | Status: DC
  Administered 2020-12-15 – 2020-12-16 (×2): 30 mg via ORAL
  Filled 2020-12-15 (×2): qty 1

## 2020-12-15 MED ORDER — TRANEXAMIC ACID-NACL 1000-0.7 MG/100ML-% IV SOLN
INTRAVENOUS | Status: AC
  Administered 2020-12-15: 1000 mg
  Filled 2020-12-15: qty 100

## 2020-12-15 MED ORDER — PRENATAL MULTIVITAMIN CH
1.0000 | ORAL_TABLET | Freq: Every day | ORAL | Status: DC
  Administered 2020-12-15 – 2020-12-18 (×4): 1 via ORAL
  Filled 2020-12-15 (×4): qty 1

## 2020-12-15 MED ORDER — METOCLOPRAMIDE HCL 5 MG/ML IJ SOLN
10.0000 mg | Freq: Once | INTRAMUSCULAR | Status: AC
  Administered 2020-12-15: 10 mg via INTRAVENOUS
  Filled 2020-12-15: qty 2

## 2020-12-15 MED ORDER — DIBUCAINE (PERIANAL) 1 % EX OINT: 1.0000 "application " | TOPICAL_OINTMENT | CUTANEOUS | Status: DC | PRN

## 2020-12-15 MED ORDER — ACETAMINOPHEN 325 MG PO TABS
650.0000 mg | ORAL_TABLET | ORAL | Status: DC | PRN
  Administered 2020-12-15 – 2020-12-17 (×5): 650 mg via ORAL
  Filled 2020-12-15 (×5): qty 2

## 2020-12-15 MED ORDER — HYDROMORPHONE HCL 2 MG PO TABS
2.0000 mg | ORAL_TABLET | ORAL | Status: DC | PRN
  Administered 2020-12-15 – 2020-12-17 (×4): 2 mg via ORAL
  Filled 2020-12-15 (×5): qty 1

## 2020-12-15 NOTE — Progress Notes (Signed)
OB Progress Note  Notified by RN, patient feeling thirsty, felt like blood sugar was high. CBG 260 at that time. Covered with 8U novolog per sliding scale. Repeat pending.   I spoke with the Diabetes Coordinator and recommendation was to increase her meal coverage to 8U Novolog with sliding scale PRN. Continue plan for Lantus 15 units tonight. They will follow up with her in the AM.    A/P PPD0 s/p SVD, course complicated by T1DM, severe preeclampsia, elevated creatinine 1. T1DM: plan as above 2. Severe preeclampsia and elevate creatinine: have been holding magnesium today due to elevated creatinine and magnesium at therapeutic levels. Her urine output has been increasing throughout the day. Afternoon CMP result shows creatinine downtrending at 1.28 (from 1.71 this AM) and magnesium 3.6. Given subtherapeutic magnesium and improving kidney function, will restart magnesium at 1g/hour with plan to check mag level q4-6 hours overnight. Will repeat CMP in AM. Blood pressures have been 140/60-90s since starting Procardia XL 30mg  this morning. Will continue to monitor.   , MD 12/15/20 5:00 PM

## 2020-12-15 NOTE — Lactation Note (Signed)
This note was copied from a baby's chart. Lactation Consultation Note  Patient Name: Boy Chiyo Fay WGNFA'O Date: 12/15/2020 Reason for consult: L&D Initial assessment;Late-preterm 34-36.6wks Age:26 hours  LC to L&D for breastfeeding assistance. This infant was born at 36+[redacted] weeks gestation to a mother with T1DM. LC assisted with positioning and observed effective latch on both breasts. LC reviewed feeding protocol for preterm infants and encouraged parents to discuss use of donor milk prn with their care team. Mother denies significant breast hx and is able to hand express. Mother and baby continued to bf as LC left the room. FOB present at bedside. Mother is aware of LC service on MBU. She was provided with the opportunity to ask questions. All concerns were addressed.  Will plan follow up visit.    Consult Status Consult Status: Follow-up Follow-up type: In-patient   Elder Negus, MA IBCLC 12/15/2020, 8:57 AM

## 2020-12-15 NOTE — Progress Notes (Signed)
Patient seen and examined.  Following AROM, she remained 5 cm until 2240 when she was noted to be 9/100/0.  Since that time, active phase has been protracted.  Feeling drowsy.   BP (!) 149/93   Pulse 89   Temp 98.8 F (37.1 C)   Resp 18   Ht 5\' 9"  (1.753 m)   LMP 04/02/2020   SpO2 98%   BMI 46.71 kg/m  Toco: Not tracing well--IUPC placed EFM: 130s, moderate variability with periods of minimal variability, + scalp stimulation with exam, currently category 1 SVE:  9.5/90/0, LOP  A/P: G1 @  [redacted]w[redacted]d with IOL for severe preeclampsia Protracted active phase--IUPC placed, contractions inadequate, can titrate prn.  EFW at 34 weeks was 96% (6lb 6oz).  Would estimate EFW today at 7.5lb and pelvis feels adequate.  Patient has become discouraged with long induction of labor. We discussed that diagnosis of arrest of dilation is 6 hours with inadequate contractions.  Recommend continued position change and repeat cervical exam in 2 hours.   Severe preeclampsia--required 1 dose of IV labetalol overnight.  BPs now mild range.  Repeat magnesium and CMP with magnesium 7.1 and creatinine increased to 1.39 from 0.61.  Will hold magnesium now and repeat level in 6 hours.

## 2020-12-15 NOTE — Progress Notes (Signed)
Inpatient Diabetes Program Recommendations  AACE/ADA: New Consensus Statement on Inpatient Glycemic Control (2015)  Target Ranges:  Prepandial:   less than 140 mg/dL      Peak postprandial:   less than 180 mg/dL (1-2 hours)      Critically ill patients:  140 - 180 mg/dL   Lab Results  Component Value Date   GLUCAP 260 (H) 12/15/2020    Review of Glycemic Control Results for ALACIA, REHMANN (MRN [id number]) as of 12/15/2020 16:27  Ref. Range 12/15/2020 09:33 12/15/2020 10:30 12/15/2020 11:30 12/15/2020 12:37 12/15/2020 16:07  Glucose-Capillary Latest Ref Range: 70 - 99 mg/dL 681 (H) 275 (H) 170 (H) 137 (H) 260 (H)    Diabetes history:  T1DM Outpatient Diabetes medications:  Pre-pregnancy-Tresiba  21 units QHS, Ozempic, Novolog 10 units TID During pregnancy-Tresiba 46 units QHS, Novolog 23 units BID with breakfast and supper Current orders for Inpatient glycemic control:  Lantus 15 units daily Novolog 0-15 units tid Novolog 3 units tid with meals  Inpatient Diabetes Program Recommendations:    Spoke with patient over the phone.  Her 4pm CBG is 260 mg/dL.  She is used to running much lower prior to pregnancy and during pregnancy.  She is thirst and does not feel well. She states she was taking Ozempic as well prior to pregnancy.  It upset her stomach so she stopped taking when she found out she was pregnant.  She states she feels she needs more meal coverage.  Feels basal might be enough (will check fasting in am).  Would recommend increasing meal coverage to Novolog 8 units tid with meals.  Discussed impact of breastfeeding on glucose.  She is aware of hypoglycemia, signs, symptoms and treatment.  Will follow up with her first thing in the morning.  She will need close follow up with endocrinologist.    Will continue to follow while inpatient.  Thank you, Dulce Sellar, RN, BSN Diabetes Coordinator Inpatient Diabetes Program [phone] (team pager from 8a-5p)

## 2020-12-15 NOTE — Progress Notes (Signed)
Repeat exam:  10/100/+1 Toco: q3-4 minutes EFM: periods of minimal to moderate variability.  Continued + scalp stimulation with exam  Will start pushing

## 2020-12-15 NOTE — Progress Notes (Signed)
Endotool, IV insulin, and D5LR stopped per verbal from provider at 1137.

## 2020-12-16 LAB — COMPREHENSIVE METABOLIC PANEL
ALT: 22 U/L (ref 0–44)
AST: 24 U/L (ref 15–41)
Albumin: 1.7 g/dL — ABNORMAL LOW (ref 3.5–5.0)
Alkaline Phosphatase: 121 U/L (ref 38–126)
Anion gap: 10 (ref 5–15)
BUN: 7 mg/dL (ref 6–20)
CO2: 22 mmol/L (ref 22–32)
Calcium: 8.1 mg/dL — ABNORMAL LOW (ref 8.9–10.3)
Chloride: 107 mmol/L (ref 98–111)
Creatinine, Ser: 0.69 mg/dL (ref 0.44–1.00)
GFR, Estimated: >60 mL/min (ref >60)
Glucose, Bld: 51 mg/dL — ABNORMAL LOW (ref 70–99)
Potassium: 3.6 mmol/L (ref 3.5–5.1)
Sodium: 139 mmol/L (ref 135–145)
Total Bilirubin: 0.1 mg/dL — ABNORMAL LOW (ref 0.3–1.2)
Total Protein: 4.4 g/dL — ABNORMAL LOW (ref 6.5–8.1)

## 2020-12-16 LAB — MAGNESIUM
Magnesium: 3.6 mg/dL — ABNORMAL HIGH (ref 1.7–2.4)
Magnesium: 3.5 mg/dL — ABNORMAL HIGH (ref 1.7–2.4)

## 2020-12-16 LAB — GLUCOSE, CAPILLARY
Glucose-Capillary: 47 mg/dL — ABNORMAL LOW (ref 70–99)
Glucose-Capillary: 82 mg/dL (ref 70–99)
Glucose-Capillary: 61 mg/dL — ABNORMAL LOW (ref 70–99)
Glucose-Capillary: 77 mg/dL (ref 70–99)
Glucose-Capillary: 74 mg/dL (ref 70–99)
Glucose-Capillary: 138 mg/dL — ABNORMAL HIGH (ref 70–99)
Glucose-Capillary: 148 mg/dL — ABNORMAL HIGH (ref 70–99)
Glucose-Capillary: 136 mg/dL — ABNORMAL HIGH (ref 70–99)
Glucose-Capillary: 164 mg/dL — ABNORMAL HIGH (ref 70–99)

## 2020-12-16 LAB — CBC
HCT: 32.8 % — ABNORMAL LOW (ref 36.0–46.0)
Hemoglobin: 10.5 g/dL — ABNORMAL LOW (ref 12.0–15.0)
MCH: 28 pg (ref 26.0–34.0)
MCHC: 32 g/dL (ref 30.0–36.0)
MCV: 87.5 fL (ref 80.0–100.0)
Platelets: 260 10*3/uL (ref 150–400)
RBC: 3.75 MIL/uL — ABNORMAL LOW (ref 3.87–5.11)
RDW: 14.3 % (ref 11.5–15.5)
WBC: 14 10*3/uL — ABNORMAL HIGH (ref 4.0–10.5)
nRBC: 0 % (ref 0.0–0.2)

## 2020-12-16 MED ORDER — CALCIUM CARBONATE ANTACID 500 MG PO CHEW
1.0000 | CHEWABLE_TABLET | ORAL | Status: DC | PRN
  Administered 2020-12-16 – 2020-12-17 (×4): 200 mg via ORAL
  Filled 2020-12-16 (×4): qty 1

## 2020-12-16 MED ORDER — FAMOTIDINE 20 MG PO TABS
10.0000 mg | ORAL_TABLET | Freq: Every day | ORAL | Status: DC
  Administered 2020-12-16 – 2020-12-18 (×3): 10 mg via ORAL
  Filled 2020-12-16 (×3): qty 1

## 2020-12-16 MED ORDER — INSULIN ASPART 100 UNIT/ML ~~LOC~~ SOLN
0.0000 [IU] | Freq: Three times a day (TID) | SUBCUTANEOUS | Status: DC
  Administered 2020-12-16 (×2): 1 [IU] via SUBCUTANEOUS
  Administered 2020-12-17 – 2020-12-18 (×2): 2 [IU] via SUBCUTANEOUS

## 2020-12-16 NOTE — Progress Notes (Signed)
Hypoglycemic Event  CBG: 47  Treatment: Was given apple juice and peanut butter crackers  Symptoms: None  Follow-up CBG: Time:0321 CBG Result 82  Possible Reasons for Event: no snack before bedtime  Comments/MD notified will notify MD    [place], Gloriajean Dell

## 2020-12-16 NOTE — Progress Notes (Addendum)
Inpatient Diabetes Program Recommendations  AACE/ADA: New Consensus Statement on Inpatient Glycemic Control (2015)  Target Ranges:  Prepandial:   less than 140 mg/dL      Peak postprandial:   less than 180 mg/dL (1-2 hours)      Critically ill patients:  140 - 180 mg/dL   Lab Results  Component Value Date   GLUCAP 77 12/16/2020    Review of Glycemic Control Results for YOLANI, VO (MRN [id number]) as of 12/16/2020 06:57  Ref. Range 12/15/2020 16:07 12/15/2020 19:00 12/15/2020 22:05 12/16/2020 02:44 12/16/2020 03:21  Glucose-Capillary Latest Ref Range: 70 - 99 mg/dL 756 (H) Novolog 8 units 253 (H) Novolog 16units 208 (H) Novolog 5 units 47 (L) 82   Diabetes history: T1DM Outpatient Diabetes medications: Pre-pregnancy-Tresiba 21 units QHS, Ozempic, Novolog 10 units TID During pregnancy-Tresiba 46 units QHS, Novolog 23 units BID with breakfast and supper Current orders for Inpatient glycemic control: Lantus 15 units daily Novolog 0-15 units tid Novolog 8 units tid with meals  Inpatient Diabetes Program Recommendations:    Please consider decreasing correction scale:  Novolog 0-9 units TID   Addendum @1000   Spoke with , RN.  Current CBG is 74 mg/dl.   RN asking if 8 units meal coverage may be too much.  Called patient and spoke with her about breakfast.  She is eating pancakes, syrup, and O.J.  She states she would administer 15 units of Novolog if at home.  Reminded patient of changes in insulin needs after delivery and while breastfeeding.  Recommend administering 8 units Novolog with breakfast due to carb intake.  Patient is in agreement.  RN notified.  Will re-check CBG 2 hrs post prandial.    Will continue to follow while inpatient.  Thank you, Selena Batten, RN, BSN Diabetes Coordinator Inpatient Diabetes Program [phone] (team pager from 8a-5p)   Will continue to follow while inpatient.  Thank you, [phone], RN, BSN Diabetes Coordinator Inpatient  Diabetes Program [phone] (team pager from 8a-5p)

## 2020-12-16 NOTE — Anesthesia Postprocedure Evaluation (Signed)
Anesthesia Post Note  Patient: Sherry Clarke  Procedure(s) Performed: AN AD HOC LABOR EPIDURAL     Patient location during evaluation: Mother Baby Anesthesia Type: Epidural Level of consciousness: awake and alert Pain management: pain level controlled Vital Signs Assessment: post-procedure vital signs reviewed and stable Respiratory status: spontaneous breathing, nonlabored ventilation and respiratory function stable Cardiovascular status: stable Postop Assessment: no headache, no backache, epidural receding, no apparent nausea or vomiting, patient able to bend at knees, adequate PO intake and able to ambulate Anesthetic complications: no   No complications documented.  Last Vitals:  Vitals:   12/16/20 0107 12/16/20 0458  BP: 134/79 123/63  Pulse: 86 95  Resp:  18  Temp: 36.8 C 36.6 C  SpO2:      Last Pain:  Vitals:   12/16/20 0510  TempSrc:   PainSc: 3    Pain Goal: Patients Stated Pain Goal: 0 (12/15/20 1026)                 Maleki Hippe Hristova

## 2020-12-16 NOTE — Progress Notes (Signed)
Fasting was 61 at 0603, juice given , encouraged pt to order some breakfast. Rechecked and it was 77.  Md was notified. Diabetes coordinator will see pt this morning.

## 2020-12-16 NOTE — Lactation Note (Signed)
This note was copied from a baby's chart. Lactation Consultation Note  Patient Name: Sherry Clarke Annastacia Duba QZESP'Q Date: 12/16/2020 Reason for consult: Follow-up assessment;1st time breastfeeding;Primapara;Late-preterm 34-36.6wks Age:26 hours  1055 - 1110 - I followed up with Ms. Sanchez. Baby was in his bassinet, and she had her pump and pump equipment with her in bed. She states that she pumped just prior to my entry. We discussed pumping practices, and I advised her to pump 8+ times a day. She states that she's pumped several times and noted drops of colostrum. I educated her and her support provider regarding what to expect with her milk volume in the first 3-5 days.  We reviewed pump settings and pump equipment. I observed her demonstrate pumping on her left breast to check her flange size. Size 24 flange appeared to be appropriate at this time, and I discussed indications for resizing her flange.  Ms. Kolinski has two personal pumps at home, a Lansinoh and a new Spectra. She is unsure as to whether she would like to breast feed. I offered to assist with latching baby, and she declined at this time. She plans to use [facility] as her provider upon discharge. I discussed OP options for lactation if she chooses to work on latching baby after discharge.  I educated on the benefits of her EBM and recommended that she could rub her EBM into baby's gums or cheeks.  I encouraged Ms. Junkins to call for lactation assistance as needed today. She verbalized understanding.   Maternal Data Does the patient have breastfeeding experience prior to this delivery?: No   Lactation Tools Discussed/Used Tools: Pump;Flanges Flange Size: 24 Breast pump type: Double-Electric Breast Pump Pump Education: Setup, frequency, and cleaning;Milk Storage   Consult Status Consult Status: Follow-up Date: 12/17/20 Follow-up type: In-patient    Walker Shadow 12/16/2020, 11:34 AM

## 2020-12-16 NOTE — Progress Notes (Signed)
Patient is eating, ambulating, foley just removed.  Pain control is good.  Denies CP/SOB.  Appropriate lochia, no complaints.  Vitals:   12/16/20 0008 12/16/20 0107 12/16/20 0458 12/16/20 0809  BP: 139/72 134/79 123/63 112/79  Pulse:  86 95 82  Resp:   18 18  Temp:  98.2 F (36.8 C) 97.9 F (36.6 C) 98.2 F (36.8 C)  TempSrc:   Oral Oral  SpO2:    100%  Height:        Fundus firm Ext: no calf tenderness  Lab Results  Component Value Date   WBC 14.0 (H) 12/16/2020   HGB 10.5 (L) 12/16/2020   HCT 32.8 (L) 12/16/2020   MCV 87.5 12/16/2020   PLT 260 12/16/2020    --/--/O POS (01/10 1210)  A/P Post partum day 1. Doing well Plts normal, CMP still pending for Cr trending BPs normal, last severe range 1/11 @17 :11, denies PEC symptoms.  Cont. Procardia XL 30mg  qd DM1: DM coor following, low BG overnight, correction scale reduced. Circ desired, consent obtained.  Other routine care.    

## 2020-12-16 NOTE — Progress Notes (Signed)
Notified MD about bedtime cbg of 208. New orders given. Will randomly check cbg again in a couple of hours

## 2020-12-17 LAB — GLUCOSE, CAPILLARY
Glucose-Capillary: 37 mg/dL — CL (ref 70–99)
Glucose-Capillary: 53 mg/dL — ABNORMAL LOW (ref 70–99)
Glucose-Capillary: 83 mg/dL (ref 70–99)
Glucose-Capillary: 55 mg/dL — ABNORMAL LOW (ref 70–99)
Glucose-Capillary: 57 mg/dL — ABNORMAL LOW (ref 70–99)
Glucose-Capillary: 72 mg/dL (ref 70–99)
Glucose-Capillary: 152 mg/dL — ABNORMAL HIGH (ref 70–99)
Glucose-Capillary: 59 mg/dL — ABNORMAL LOW (ref 70–99)
Glucose-Capillary: 128 mg/dL — ABNORMAL HIGH (ref 70–99)
Glucose-Capillary: 226 mg/dL — ABNORMAL HIGH (ref 70–99)

## 2020-12-17 MED ORDER — NIFEDIPINE ER OSMOTIC RELEASE 30 MG PO TB24
60.0000 mg | ORAL_TABLET | Freq: Every day | ORAL | Status: DC
  Administered 2020-12-17 – 2020-12-18 (×2): 60 mg via ORAL
  Filled 2020-12-17 (×2): qty 2

## 2020-12-17 MED ORDER — INSULIN ASPART 100 UNIT/ML ~~LOC~~ SOLN
5.0000 [IU] | Freq: Once | SUBCUTANEOUS | Status: AC
  Administered 2020-12-17: 5 [IU] via SUBCUTANEOUS

## 2020-12-17 MED ORDER — INSULIN GLARGINE 100 UNIT/ML ~~LOC~~ SOLN
8.0000 [IU] | Freq: Every day | SUBCUTANEOUS | Status: DC
  Administered 2020-12-17: 8 [IU] via SUBCUTANEOUS
  Filled 2020-12-17 (×2): qty 0.08

## 2020-12-17 MED ORDER — IBUPROFEN 600 MG PO TABS
600.0000 mg | ORAL_TABLET | Freq: Four times a day (QID) | ORAL | Status: DC | PRN
  Administered 2020-12-17: 600 mg via ORAL
  Filled 2020-12-17: qty 1

## 2020-12-17 NOTE — Progress Notes (Signed)
Hypoglycemic Event  CBG: 37 and 53  Treatment: simple carbohydrate; juice and crackers 2nd treatment: muffin at patient bedside  Symptoms: clammy and shakiness  Follow-up CBG: Time: 0432, 0517  CBG Result: 53, 83  Possible Reasons for Event: lack of nutrition  Comments/MD notified: Dr. Claiborne Billings made aware. Diabetes coordinator will be made aware as well.    Sherry Clarke

## 2020-12-17 NOTE — Progress Notes (Signed)
Inpatient Diabetes Program Recommendations  ADA Standards of Care 2021 Diabetes in Pregnancy Target Glucose Ranges:  Fasting: 60 - 90 mg/dL Preprandial: 60 - 121 mg/dL 1 hr postprandial: Less than 140mg /dL (from first bite of meal) 2 hr postprandial: Less than 120 mg/dL (from first bit of meal)    Lab Results  Component Value Date   GLUCAP 55 (L) 12/17/2020    Review of Glycemic Control Results for Sherry Clarke, Sherry Clarke (MRN [name]) as of 12/17/2020 10:13  Ref. Range 12/17/2020 04:02 12/17/2020 04:32 12/17/2020 05:18 12/17/2020 09:44 12/17/2020 10:08  Glucose-Capillary Latest Ref Range: 70 - 99 mg/dL 37 (LL) 53 (L) 83 57 (L) 55 (L)     Inpatient Diabetes Program Recommendations:    Holding meal coverage this morning per MD order-agree.  Also recommend decreasing meal coverage to Novolog 4 units TID if cbg > 80 mg/dL and eats at least 50 %.  Spoke with patient as well and she verbalizes understanding of decreased insulin needs.    Will continue to follow while inpatient.  Thank you, 12/19/2020, RN, BSN Diabetes Coordinator Inpatient Diabetes Program [phone] (team pager from 8a-5p)

## 2020-12-17 NOTE — Progress Notes (Signed)
Inpatient Diabetes Program Recommendations  ADA Standards of Care 2021 Diabetes in Pregnancy Target Glucose Ranges:  Fasting: 60 - 90 mg/dL Preprandial: 60 - 706 mg/dL 1 hr postprandial: Less than 140mg /dL (from first bite of meal) 2 hr postprandial: Less than 120 mg/dL (from first bit of meal)     Lab Results  Component Value Date   GLUCAP 83 12/17/2020    Review of Glycemic Control Results for CADINCE, HILSCHER (MRN [name]) as of 12/17/2020 07:56  Ref. Range 12/16/2020 09:44 12/16/2020 12:00 12/16/2020 14:09 12/16/2020 18:45 12/16/2020 21:42 12/17/2020 04:02 12/17/2020 04:32 12/17/2020 05:18  Glucose-Capillary Latest Ref Range: 70 - 99 mg/dL 74 12/19/2020 (H) 176 (H) 160 (H) 164 (H) 37 (LL) 53 (L) 83   Diabetes history: T1DM Outpatient Diabetes medications: Pre-pregnancy-Tresiba 21 units QHS,Ozempic,Novolog 10 units TID During pregnancy-Tresiba 46 units QHS, Novolog 23 units BID with breakfast and supper Current orders for Inpatient glycemic control: Lantus 15 units daily Novolog 0-9 units tid Novolog 8 units tid with meals  Inpatient Diabetes Program Recommendations:    Please consider reducing Lantus to 8 units daily.  Will continue to follow while inpatient.  Thank you, 737, RN, BSN Diabetes Coordinator Inpatient Diabetes Program [phone] (team pager from 8a-5p)

## 2020-12-17 NOTE — Lactation Note (Signed)
This note was copied from a baby's chart. Lactation Consultation Note  Patient Name: Sherry Clarke IOEVO'J Date: 12/17/2020 Reason for consult: Follow-up assessment;Late-preterm 34-36.6wks;Primapara;1st time breastfeeding Age:26 hours   P1 mother whose infant is now 21 hours old.  This is a LPTI at 36+5 weeks with a CGA of 37+0 weeks.  Baby is under phototherapy via bili blanket.  The last bilirubin level was 10.9 mg/dl at 48 hours of life.  Mother's feeding preference is breast/bottle.  Upon chart review I noticed that mother has only been formula feeding since the [facility] visit yesterday.  Asked mother about her feeding preference and how I may assist her.  She is still interested in pumping (has not pumped since approximately 1500 yesterday).  She is not interested in latching at this time.  It is uncertain as to whether or not she will eventually desire to latch.  Discussed the importance of consistent pumping every 2-3 hours during the day and every 3-4 hours during the night.  Asked if I may review the pump and mother agreeable.  Pump parts, assembly, disassembly and cleaning reviewed.  Changed mother from a #24 flange size to a #27 flange size for greater fit and comfort.  Observed mother pumping while reviewing breast feeding/pumping basics.  Mother will call for latch assistance if desired.  Mother has 2 pumps for home use.  Father present and feeding baby during my visit.     Maternal Data    Feeding    LATCH Score                   Interventions    Lactation Tools Discussed/Used Pump Education: Setup, frequency, and cleaning;Milk Storage (Reviewed)   Consult Status Consult Status: Follow-up Date: 12/18/20 Follow-up type: In-patient    Rex Oesterle R Jolee Critcher 12/17/2020, 10:54 AM

## 2020-12-17 NOTE — Progress Notes (Signed)
Hypoglycemic Event  CBG: 57, 55  Treatment: Pt was given crackers and juice. Recheck was 55. Pt was also given breakfast.   Symptoms: No symptoms.   Follow-up CBG: Time:1008, 1034 CBG Result:55, 72  Possible Reasons for Event: Spoke with Provider and diabetic coordinator. Orders will be revised regarding long-acting insulin and with meal coverage.   Comments/MD notified: Dr. Chestine Spore. Orders modified per MD and Diabetic Coordinator.    Azhar Yogi Duaine Dredge

## 2020-12-17 NOTE — Progress Notes (Signed)
Patient is eating, ambulating, voiding without difficulty.  Pain control is good.  Denies CP/SOB. Headache has resolved since delivery.  Over the last 12 hours, BPs have trended up to consistently 140-150s/80-90s.   She also had an episode of hypoglycemia overnight  Vitals:   12/16/20 2137 12/17/20 0036 12/17/20 0435 12/17/20 0758  BP: (!) 157/93 (!) 142/86 (!) 145/85 (!) 154/92  Pulse: 99 82 77 84  Resp:  19 18   Temp:   (!) 97.5 F (36.4 C) 98 F (36.7 C)  TempSrc:   Oral   SpO2:  100% 99%   Weight:      Height:        Fundus firm Ext: no calf tenderness.  2+ LE edema bilaterally to thighs, improved since delivery  Lab Results  Component Value Date   WBC 14.0 (H) 12/16/2020   HGB 10.5 (L) 12/16/2020   HCT 32.8 (L) 12/16/2020   MCV 87.5 12/16/2020   PLT 260 12/16/2020    --/--/O POS (01/10 1210)  A/P [id number] PPD #2 s/p SVD at 36 weeks for severe preeclampsia Severe preeclampsia.  Creatinine returned to normal yesterday at 0.69.  BPs were mild range day 1 following delivery, but now trending up as third spacing starts to resolve.  Will increase procardia XL to 60mg  daily DM1: DM coor following, low BG overnight, Lantus decreased to 8U qhs per their recommendation.  Will continue to follow closely.    [facility]

## 2020-12-17 NOTE — Progress Notes (Addendum)
Hypoglycemic Event  CBG: 59  Treatment: Pt given juice and dinner meal was just given.   Symptoms: Pt did not report an symptoms.  Follow-up CBG: Time:2045 CBG Result:128  Possible Reasons for Event: Patient did not eat since 1340, dinner was late and no insulin coverage was given on scheduled time.   Comments/MD notified:MD attempted to call at 2215 after bedtime CBG of 226 at 2200 to report all events and request orders if needed. MD called back at 2230 with new orders of additional insulin coverage of 5 units of novolog for one time dose with scheduled lantus of 8 units.   Wendy Poet

## 2020-12-18 DIAGNOSIS — O24919 Unspecified diabetes mellitus in pregnancy, unspecified trimester: Secondary | ICD-10-CM

## 2020-12-18 LAB — GLUCOSE, CAPILLARY
Glucose-Capillary: 114 mg/dL — ABNORMAL HIGH (ref 70–99)
Glucose-Capillary: 153 mg/dL — ABNORMAL HIGH (ref 70–99)

## 2020-12-18 MED ORDER — ACETAMINOPHEN 325 MG PO TABS: 650.0000 mg | ORAL_TABLET | ORAL | 0 refills | Status: AC | PRN

## 2020-12-18 MED ORDER — NIFEDIPINE ER OSMOTIC RELEASE 30 MG PO TB24
30.0000 mg | ORAL_TABLET | Freq: Every day | ORAL | Status: DC
  Administered 2020-12-18: 30 mg via ORAL
  Filled 2020-12-18: qty 1

## 2020-12-18 MED ORDER — SENNOSIDES-DOCUSATE SODIUM 8.6-50 MG PO TABS: 2.0000 | ORAL_TABLET | ORAL | 1 refills | Status: AC

## 2020-12-18 MED ORDER — IBUPROFEN 600 MG PO TABS: 600.0000 mg | ORAL_TABLET | Freq: Four times a day (QID) | ORAL | 0 refills | Status: AC | PRN

## 2020-12-18 MED ORDER — INSULIN ASPART 100 UNIT/ML ~~LOC~~ SOLN: 4.0000 [IU] | Freq: Three times a day (TID) | SUBCUTANEOUS | 11 refills | Status: AC

## 2020-12-18 MED ORDER — NIFEDIPINE ER OSMOTIC RELEASE 90 MG PO TB24: 90.0000 mg | ORAL_TABLET | Freq: Every day | ORAL | 1 refills | Status: AC

## 2020-12-18 MED ORDER — INSULIN ASPART 100 UNIT/ML ~~LOC~~ SOLN
4.0000 [IU] | Freq: Three times a day (TID) | SUBCUTANEOUS | Status: DC
  Administered 2020-12-18: 4 [IU] via SUBCUTANEOUS

## 2020-12-18 MED ORDER — INSULIN GLARGINE 100 UNIT/ML ~~LOC~~ SOLN: 8.0000 [IU] | Freq: Every day | SUBCUTANEOUS | 11 refills | Status: AC

## 2020-12-18 NOTE — Plan of Care (Signed)
  Problem: Education: Goal: Knowledge of General Education information will improve Description: Including pain rating scale, medication(s)/side effects and non-pharmacologic comfort measures Outcome: Adequate for Discharge   Problem: Health Behavior/Discharge Planning: Goal: Ability to manage health-related needs will improve Outcome: Adequate for Discharge   Problem: Clinical Measurements: Goal: Ability to maintain clinical measurements within normal limits will improve Outcome: Adequate for Discharge Goal: Will remain free from infection Outcome: Adequate for Discharge Goal: Diagnostic test results will improve Outcome: Adequate for Discharge Goal: Respiratory complications will improve Outcome: Adequate for Discharge Goal: Cardiovascular complication will be avoided Outcome: Adequate for Discharge   Problem: Activity: Goal: Risk for activity intolerance will decrease Outcome: Adequate for Discharge   Problem: Nutrition: Goal: Adequate nutrition will be maintained Outcome: Adequate for Discharge   Problem: Coping: Goal: Level of anxiety will decrease Outcome: Adequate for Discharge   Problem: Elimination: Goal: Will not experience complications related to bowel motility Outcome: Adequate for Discharge Goal: Will not experience complications related to urinary retention Outcome: Adequate for Discharge   Problem: Pain Managment: Goal: General experience of comfort will improve Outcome: Adequate for Discharge   Problem: Safety: Goal: Ability to remain free from injury will improve Outcome: Adequate for Discharge   Problem: Skin Integrity: Goal: Risk for impaired skin integrity will decrease Outcome: Adequate for Discharge   Problem: Education: Goal: Knowledge of condition will improve Outcome: Adequate for Discharge Goal: Individualized Educational Video(s) Outcome: Adequate for Discharge Goal: Individualized Newborn Educational Video(s) Outcome: Adequate for  Discharge   Problem: Activity: Goal: Will verbalize the importance of balancing activity with adequate rest periods Outcome: Adequate for Discharge Goal: Ability to tolerate increased activity will improve Outcome: Adequate for Discharge   Problem: Coping: Goal: Ability to identify and utilize available resources and services will improve Outcome: Adequate for Discharge   Problem: Life Cycle: Goal: Chance of risk for complications during the postpartum period will decrease Outcome: Adequate for Discharge   Problem: Role Relationship: Goal: Ability to demonstrate positive interaction with newborn will improve Outcome: Adequate for Discharge   Problem: Skin Integrity: Goal: Demonstration of wound healing without infection will improve Outcome: Adequate for Discharge   Problem: Education: Goal: Knowledge of disease or condition will improve Outcome: Adequate for Discharge Goal: Knowledge of the prescribed therapeutic regimen will improve Outcome: Adequate for Discharge   Problem: Fluid Volume: Goal: Peripheral tissue perfusion will improve Outcome: Adequate for Discharge   Problem: Clinical Measurements: Goal: Complications related to disease process, condition or treatment will be avoided or minimized Outcome: Adequate for Discharge   

## 2020-12-18 NOTE — Discharge Summary (Addendum)
Postpartum Discharge Summary  Patient Name: Sherry Clarke DOB: 01/22/1996 MRN: [id number]  Date of admission: 12/12/2020 Delivery date:12/15/2020  Delivering provider: Jerelyn Charles  Date of discharge: 12/18/2020  Admitting diagnosis: Pre-eclampsia during pregnancy in third trimester, antepartum [O14.93] Intrauterine pregnancy: [redacted]w[redacted]d    Secondary diagnosis:  Active Problems:   Pre-eclampsia during pregnancy in third trimester, antepartum   Diabetes mellitus affecting pregnancy  Additional problems: none    Discharge diagnosis: Preterm Pregnancy Delivered, Preeclampsia (severe) and DM1                                              Post partum procedures:none Augmentation: AROM, Pitocin, Cytotec and IP Foley Complications: None  Hospital course: Induction of Labor With Vaginal Delivery   25y.o. yo G1P0101 at 361w5das admitted to the hospital 12/12/2020 for induction of labor.  Indication for induction: severe preeclampsia, by persistent headache.  Patient labor course as follows: received cytotec and foley balloon, then pitocin and AROM. She had a protracted active phase, IUPC placed. L&D course also significant for severe range Bps and creatinine increased to 1.39, magnesium held intrapartum.  Membrane Rupture Time/Date: 2:08 PM ,12/14/2020   Delivery Method:Vaginal, Spontaneous  Episiotomy: None  Lacerations:  None  Details of delivery can be found in separate delivery note.  Patient postpartum course included magnesium for 24 hours (intermittently held due to elevated Cr and therapeutic range). Procardia started postpartum, increased to 90 xL. For her diabetes, DM coordinator assisted with insulin management. Patient plans to f/u with Endocrine PP. Patient strongly desired discharged home 12/18/20, overall BP normotensive-mild range, recommend home BP monitoring and close follow up in the office  Newborn Data: Birth date:12/15/2020  Birth time:7:15 AM  Gender:Female  Living  status:Living  Apgars:3 ,7  Weight:3635 g   Magnesium Sulfate received: Yes: Seizure prophylaxis BMZ received: No Rhophylac:N/A MMR:N/A T-DaP:Given prenatally Flu: Yes given prenatally  Transfusion:No  Physical exam  Vitals:   12/18/20 0538 12/18/20 0730 12/18/20 1105 12/18/20 1629  BP: (!) 144/90 (!) 114/91 (!) 145/88 131/81  Pulse: (!) 103 (!) 128 (!) 109 (!) 107  Resp: 16 17 17 17   Temp: 97.9 F (36.6 C) 98 F (36.7 C) 97.8 F (36.6 C) 98.4 F (36.9 C)  TempSrc: Oral Oral Oral Oral  SpO2:    100%  Weight:      Height:       General: alert and no distress Lochia: appropriate Uterine Fundus: firm DVT Evaluation: No evidence of DVT seen on physical exam.  Labs: Lab Results  Component Value Date   WBC 14.0 (H) 12/16/2020   HGB 10.5 (L) 12/16/2020   HCT 32.8 (L) 12/16/2020   MCV 87.5 12/16/2020   PLT 260 12/16/2020   CMP Latest Ref Rng & Units 12/16/2020  Glucose 70 - 99 mg/dL 51(L)  BUN 6 - 20 mg/dL 7  Creatinine 0.44 - 1.00 mg/dL 0.69  Sodium 135 - 145 mmol/L 139  Potassium 3.5 - 5.1 mmol/L 3.6  Chloride 98 - 111 mmol/L 107  CO2 22 - 32 mmol/L 22  Calcium 8.9 - 10.3 mg/dL 8.1(L)  Total Protein 6.5 - 8.1 g/dL 4.4(L)  Total Bilirubin 0.3 - 1.2 mg/dL 0.1(L)  Alkaline Phos 38 - 126 U/L 121  AST 15 - 41 U/L 24  ALT 0 - 44 U/L 22   Edinburgh Score:  Edinburgh Postnatal Depression Scale Screening Tool 12/17/2020  I have been able to laugh and see the funny side of things. 0  I have looked forward with enjoyment to things. 0  I have blamed myself unnecessarily when things went wrong. 1  I have been anxious or worried for no good reason. 2  I have felt scared or panicky for no good reason. 1  Things have been getting on top of me. 0  I have been so unhappy that I have had difficulty sleeping. 0  I have felt sad or miserable. 1  I have been so unhappy that I have been crying. 0  The thought of harming myself has occurred to me. 0  Edinburgh Postnatal  Depression Scale Total 5      After visit meds:  Allergies as of 12/18/2020      Reactions   Oxycodone Nausea And Vomiting      Medication List    STOP taking these medications   aspirin EC 81 MG tablet   dicyclomine 20 MG tablet Commonly known as: BENTYL     TAKE these medications   acetaminophen 325 MG tablet Commonly known as: Tylenol Take 2 tablets (650 mg total) by mouth every 4 (four) hours as needed (for pain scale < 4).   albuterol 108 (90 Base) MCG/ACT inhaler Commonly known as: VENTOLIN HFA Inhale 2 puffs into the lungs every 6 (six) hours as needed for wheezing or shortness of breath.   cyclobenzaprine 10 MG tablet Commonly known as: FLEXERIL Take 1 tablet (10 mg total) by mouth 2 (two) times daily as needed (tension headache).   fluticasone 50 MCG/ACT nasal spray Commonly known as: Flonase Place 1 spray into both nostrils in the morning and at bedtime for 14 days.   ibuprofen 600 MG tablet Commonly known as: ADVIL Take 1 tablet (600 mg total) by mouth every 6 (six) hours as needed for cramping.   insulin aspart 100 UNIT/ML injection Commonly known as: novoLOG Inject 4 Units into the skin 3 (three) times daily with meals. What changed:   how much to take  when to take this   insulin glargine 100 UNIT/ML injection Commonly known as: LANTUS Inject 0.08 mLs (8 Units total) into the skin at bedtime. What changed: how much to take   NIFEdipine 90 MG 24 hr tablet Commonly known as: Procardia XL Take 1 tablet (90 mg total) by mouth daily.   prenatal multivitamin Tabs tablet Take 1 tablet by mouth daily at 12 noon.   senna-docusate 8.6-50 MG tablet Commonly known as: Senokot-S Take 2 tablets by mouth daily. Start taking on: December 19, 2020        Discharge home in stable condition Infant Feeding: pumping Infant Disposition:rooming in Discharge instruction: per After Visit Summary and Postpartum booklet. Activity: Advance as tolerated.  Pelvic rest for 6 weeks.  Diet: carb modified diet Anticipated Birth Control: Unsure Postpartum Appointment:within 4 days Additional Postpartum F/U: BP check within 4 days Future Appointments:No future appointments. Follow up Visit:  Follow-up Information    Taam-Akelman, Lawrence Santiago, MD. Schedule an appointment as soon as possible for a visit in 4 day(s).   Specialty: Obstetrics and Gynecology Contact information: [address] [place] [address] [phone]                   12/18/2020 Jonelle Sidle, MD

## 2020-12-18 NOTE — Progress Notes (Signed)
I spent time with Sherry Clarke and her family.  She is feeling overwhelmed by another set back and all of the complications she has experienced as part of her delivery.  Her mother and SO are good supports to her and both were present (mother by phone) with her.  I offered affirmation and gave her space to express her emotions.    Chaplain Dyanne Carrel, Bcc Pager, [phone] 11:30 AM

## 2020-12-18 NOTE — Discharge Instructions (Signed)
Postpartum Care After Vaginal Delivery The following information offers guidance about how to care for yourself from the time you deliver your baby to 6-12 weeks after delivery (postpartum period). If you have problems or questions, contact your health care provider for more specific instructions. Follow these instructions at home: Vaginal bleeding  It is normal to have vaginal bleeding (lochia) after delivery. Wear a sanitary pad for bleeding and discharge. ? During the first week after delivery, the amount and appearance of lochia is often similar to a menstrual period. ? Over the next few weeks, it will gradually decrease to a dry, yellow-brown discharge. ? For most women, lochia stops completely by 4-6 weeks after delivery, but can vary.  Change your sanitary pads frequently. Watch for any changes in your flow, such as: ? A sudden increase in volume. ? A change in color. ? Large blood clots.  If you pass a blood clot from your vagina, save it and call your health care provider. Do not flush blood clots down the toilet before talking with your health care provider.  Do not use tampons or douches until your health care provider approves.  If you are not breastfeeding, your period should return 6-8 weeks after delivery. If you are feeding your baby breast milk only, your period may not return until you stop breastfeeding. Perineal care  Keep the area between the vagina and the anus (perineum) clean and dry. Use medicated pads and pain-relieving sprays and creams as directed.  If you had a surgical cut in the perineum (episiotomy) or a tear, check the area for signs of infection until you are healed. Check for: ? More redness, swelling, or pain. ? Fluid or blood coming from the cut or tear. ? Warmth. ? Pus or a bad smell.  You may be given a squirt bottle to use instead of wiping to clean the perineum area after you use the bathroom. Pat the area gently to dry it.  To relieve pain  caused by an episiotomy, a tear, or swollen veins in the anus (hemorrhoids), take a warm sitz bath 2-3 times a day. In a sitz bath, the warm water should only come up to your hips and cover your buttocks.   Breast care  In the first few days after delivery, your breasts may feel heavy, full, and uncomfortable (breast engorgement). Milk may also leak from your breasts. Ask your health care provider about ways to help relieve the discomfort.  If you are breastfeeding: ? Wear a bra that supports your breasts and fits well. Use breast pads to absorb milk that leaks. ? Keep your nipples clean and dry. Apply creams and ointments as told. ? You may have uterine contractions every time you breastfeed for up to several weeks after delivery. This helps your uterus return to its normal size. ? If you have any problems with breastfeeding, notify your health care provider or lactation consultant.  If you are not breastfeeding: ? Avoid touching your breasts. Do not squeeze out (express) milk. Doing this can make your breasts produce more milk. ? Wear a good-fitting bra and use cold packs to help with swelling. Intimacy and sexuality  Ask your health care provider when you can engage in sexual activity. This may depend upon: ? Your risk of infection. ? How fast you are healing. ? Your comfort and desire to engage in sexual activity.  You are able to get pregnant after delivery, even if you have not had your period. Talk with   your health care provider about methods of birth control (contraception) or family planning if you desire future pregnancies. Medicines  Take over-the-counter and prescription medicines only as told by your health care provider.  Take an over-the-counter stool softener to help ease bowel movements as told by your health care provider.  If you were prescribed an antibiotic medicine, take it as told by your health care provider. Do not stop taking the antibiotic even if you start to  feel better.  Review all previous and current prescriptions to check for possible transfer into breast milk. Activity  Gradually return to your normal activities as told by your health care provider.  Rest as much as possible. Nap while your baby is sleeping. Eating and drinking  Drink enough fluid to keep your urine pale yellow.  To help prevent or relieve constipation, eat high-fiber foods every day.  Choose healthy eating to support breastfeeding or weight loss goals.  Take your prenatal vitamins until your health care provider tells you to stop.   General tips/recommendations  Do not use any products that contain nicotine or tobacco. These products include cigarettes, chewing tobacco, and vaping devices, such as e-cigarettes. If you need help quitting, ask your health care provider.  Do not drink alcohol, especially if you are breastfeeding.  Do not take medications or drugs that are not prescribed to you, especially if you are breastfeeding.  Visit your health care provider for a postpartum checkup within the first 3-6 weeks after delivery.  Complete a comprehensive postpartum visit no later than 12 weeks after delivery.  Keep all follow-up visits for you and your baby. Contact a health care provider if:  You feel unusually sad or worried.  Your breasts become red, painful, or hard.  You have a fever or other signs of an infection.  You have bleeding that is soaking through one pad an hour or you have blood clots.  You have a severe headache that doesn't go away or you have vision changes.  You have nausea and vomiting and are unable to eat or drink anything for 24 hours. Get help right away if:  You have chest pain or difficulty breathing.  You have sudden, severe leg pain.  You faint or have a seizure.  You have thoughts about hurting yourself or your baby. If you ever feel like you may hurt yourself or others, or have thoughts about taking your own life,  get help right away. Go to your nearest emergency department or:  Call your local emergency services (911 in the [place]  The National Suicide Prevention Lifeline at 1-800-273-8255. This suicide crisis helpline is open 24 hours a day.  Text the Crisis Text Line at 741741 (in the [place] Summary  The period of time after you deliver your newborn up to 6-12 weeks after delivery is called the postpartum period.  Keep all follow-up visits for you and your baby.  Review all previous and current prescriptions to check for possible transfer into breast milk.  Contact a health care provider if you feel unusually sad or worried during the postpartum period. This information is not intended to replace advice given to you by your health care provider. Make sure you discuss any questions you have with your health care provider. Document Revised: 08/06/2020 Document Reviewed: 08/06/2020 Elsevier Patient Education  2021 [employer]  Postpartum Hypertension Postpartum hypertension is high blood pressure that is higher than normal after childbirth. It usually starts within 1 to 2 days after delivery, but   it can happen at any time for up to 6 weeks after delivery. For some women, medical treatment is required to prevent serious complications, such as seizures or stroke. What are the causes? The cause of this condition is not well understood. In some cases, the cause may not be known. Certain conditions may increase your risk. These include:  Hypertension that existed before pregnancy (chronic hypertension).  Hypertension that comes as a result of pregnancy (gestational hypertension).  Hypertensive disorders during pregnancy or seizures in women who have high blood pressure during pregnancy. These conditions are called preeclampsia and eclampsia.  A condition in which the liver, platelets, and red blood cells are damaged during pregnancy (HELLP syndrome).  Obesity.  Diabetes. What are the signs or  symptoms? As with all types of hypertension, postpartum hypertension may not have any symptoms. Depending on how high your blood pressure is, you may experience:  Headaches. These may be mild, moderate, or severe. They may also be steady, constant, or sudden in onset (thunderclap headache).  Vision changes, such as blurry vision, flashing lights, or seeing spots.  Nausea and vomiting.  Pain in the upper right side of your abdomen.  Shortness of breath.  Difficulty breathing while lying down.  A decrease in the amount of urine that you pass. How is this diagnosed? This condition may be diagnosed based on the results of a physical exam, blood pressure measurements, and blood and urine tests. You may also have other tests, such as a CT scan or an MRI, to check for other problems of postpartum hypertension. How is this treated? If blood pressure is high enough to require treatment, your options may include:  Medicines to reduce blood pressure (antihypertensives). Tell your health care provider if you are breastfeeding or if you plan to breastfeed. There are many antihypertensive medicines that are safe to take while breastfeeding.  Treating medical conditions that are causing hypertension.  Treating the complications of hypertension, such as seizures, stroke, or kidney problems. Your health care provider will also continue to monitor your blood pressure closely until it is within a safe range for you. Follow these instructions at home: Learn your goal blood pressure Two numbers make up your blood pressure. The first number is called systolic pressure. The second is called diastolic pressure. An example of a blood pressure reading is "120 over 80" (or 120/80). For most people, goal blood pressure is:  First number: below 140.  Second number: below 90. Your blood pressure is above normal even if only the top or bottom number is above normal. Know what to do before you take your blood  pressure 30 minutes before you check your blood pressure:  Do not drink caffeine.  Do not drink alcohol.  Avoid food and drink.  Do not smoke.  Do not exercise. 5 minutes before you check your blood pressure:  Use the bathroom and urinate so that you have an empty bladder.  Sit quietly in a dining room chair. Do not sit in a soft couch or an armchair. Do not talk. Know how to take your blood pressure To check your blood pressure, follow the instructions in the manual that came with your blood pressure monitor. If you have a digital blood pressure monitor, the instructions may be as follows: 1. Sit up straight. 2. Place your feet on the floor. Do not cross your ankles or legs. 3. Rest your left arm at the level of your heart. You may rest it on a table, desk,   or chair. 4. Pull up your shirt sleeve. 5. Wrap the blood pressure cuff around the upper part of your left arm. The cuff should be 1 inch (2.5 cm) above your elbow. It is best to wrap the cuff around bare skin. 6. Fit the cuff snugly around your arm. You should be able to place only one finger between the cuff and your arm. 7. Put the cord inside the groove of your elbow. 8. Press the power button. 9. Sit quietly while the cuff fills with air and loses air. 10. Write down the numbers on the screen. These are your blood pressure readings. 11. Wait 1-2 minutes and then repeat steps 1-10.   Record your blood pressure readings Follow your health care provider's instructions on how to record your blood pressure readings. If you were asked to use this form, follow these instructions:  Get one reading in the morning (a.m.) before you take any medicines.  Get one reading in the evening (p.m.) before supper.  Take at least 2 readings with each blood pressure check. This makes sure the results are correct. Wait 1-2 minutes between measurements.  Write down the results in the spaces on this form. Date: _______________________  a.m.  _____________________(1st reading) _____________________(2nd reading)  p.m. _____________________(1st reading) _____________________(2nd reading) Date: _______________________  a.m. _____________________(1st reading) _____________________(2nd reading)  p.m. _____________________(1st reading) _____________________(2nd reading) Date: _______________________  a.m. _____________________(1st reading) _____________________(2nd reading)  p.m. _____________________(1st reading) _____________________(2nd reading) Date: _______________________  a.m. _____________________(1st reading) _____________________(2nd reading)  p.m. _____________________(1st reading) _____________________(2nd reading) Date: _______________________  a.m. _____________________(1st reading) _____________________(2nd reading)  p.m. _____________________(1st reading) _____________________(2nd reading) General instructions  Take over-the-counter and prescription medicines only as told by your health care provider.  Do not use any products that contain nicotine or tobacco. These products include cigarettes, chewing tobacco, and vaping devices, such as e-cigarettes. If you need help quitting, ask your health care provider.  Check your blood pressure as often as recommended by your health care provider.  Return to your normal activities as told by your health care provider. Ask your health care provider what activities are safe for you.  Keep all follow-up visits. This is important. Contact a health care provider if:  You have new symptoms, such as: ? A headache that does not get better. ? Dizziness. ? Visual changes. ? Nausea and vomiting. Get help right away if:  You develop difficulty breathing.  You have chest pain.  You faint.  You have any symptoms of a stroke. "BE FAST" is an easy way to remember the main warning signs of a stroke: ? B - Balance. Signs are dizziness, sudden trouble walking, or loss of  balance. ? E - Eyes. Signs are trouble seeing or a sudden change in vision. ? F - Face. Signs are sudden weakness or numbness of the face, or the face or eyelid drooping on one side. ? A - Arms. Signs are weakness or numbness in an arm. This happens suddenly and usually on one side of the body. ? S - Speech. Signs are sudden trouble speaking, slurred speech, or trouble understanding what people say. ? T - Time. Time to call emergency services. Write down what time symptoms started.  You have other signs of a stroke, such as: ? A sudden, severe headache with no known cause. ? Nausea or vomiting. ? Seizure. These symptoms may represent a serious problem that is an emergency. Do not wait to see if the symptoms will go away. Get medical help right away.   Call your local emergency services (911 in the [place] Do not drive yourself to the hospital. Summary  Postpartum hypertension is high blood pressure that remains higher than normal after childbirth.  For some women, medical treatment is required to prevent serious complications, such as seizures or stroke.  Follow your health care provider's instructions on how to record your blood pressure readings.  Keep all follow-up visits. This is important. This information is not intended to replace advice given to you by your health care provider. Make sure you discuss any questions you have with your health care provider. Document Revised: 08/18/2020 Document Reviewed: 08/18/2020 Elsevier Patient Education  2021 [employer] Inc.  

## 2020-12-18 NOTE — Progress Notes (Signed)
I followed up with Dad when I ran into him in the hallway.  He was making sure she was eating and supporting her.  They hope to be discharged tonight and be able to go home for a little bit.  I provided some basic information about perinatal mood and anxiety disorder to be sure that he is able to help monitor for any symptoms.    Chaplain Dyanne Carrel, Bcc Pager, [phone] 4:14 PM

## 2020-12-18 NOTE — Progress Notes (Signed)
Post Partum Day 3 Subjective: no complaints, up ad lib, voiding and tolerating PO Denies HA/VC/RUQ pain/SOB  Objective: Patient Vitals for the past 24 hrs:  BP Temp Temp src Pulse Resp SpO2  12/18/20 0730 (!) 114/91 98 F (36.7 C) Oral (!) 128 17 --  12/18/20 0538 (!) 144/90 97.9 F (36.6 C) Oral (!) 103 16 --  12/18/20 0019 127/82 98.1 F (36.7 C) Oral (!) 122 18 100 %  12/17/20 2004 (!) 143/91 -- -- (!) 111 18 100 %  12/17/20 1632 135/80 98 F (36.7 C) Oral 91 18 100 %  12/17/20 1630 -- -- -- -- -- 99 %  12/17/20 1325 (!) 157/86 -- -- 69 -- 99 %    Physical Exam:  General: alert and no distress Lochia: appropriate Uterine Fundus: firm DVT Evaluation: No evidence of DVT seen on physical exam.  Recent Labs    12/16/20 0833  WBC 14.0*  HGB 10.5*  HCT 32.8*  PLT 260    Recent Labs    12/15/20 1535 12/16/20 0833  NA 132* 139  K 3.8 3.6  CL 103 107  BUN 11 7  CREATININE 1.28* 0.69  GLUCOSE 278* 51*  BILITOT 1.0 0.1*  ALT 24 22  AST 28 24  ALKPHOS 139* 121  PROT 4.7* 4.4*  ALBUMIN 1.8* 1.7*    Recent Labs    12/15/20 1535 12/16/20 0044 12/16/20 0833  CALCIUM 7.7*  --  8.1*  MG 3.6* 3.6* 3.5*   Assessment/Plan: Sherry Clarke 24 y.o. G1P0101 PPD#3 sp SVD at 36w due to severe preeclampsia 1. PPC: doing well 2. Severe preeclampsia: s/p 24h magnesium postpartum, required IV antihypertensives in labor, procardia started, increased to 60mg  QD 1/13.  -Cr now improved, AST/ALT wnl -Monitor BP to this afternoon, adjust meds prn. Patient strongly desires DC today, discussed monitoring BP at home with close follow up in clinic 3. DM1: DM coordinator following, now on lantus 8u QHS and novolog 4u TID -Has plan to f/u outpatient with Endo 4. Recent COVID infection 12/05/2020.  5. Received tdap, flu vaccines in pregnancy. COVID vaccines 02/2020 and 03/2020. Rubella immune, blood type O POS, pumping, baby boy in room, under lights   LOS: 5 days   Sherry Clarke K  Sherry Clarke 12/18/2020, 11:08 AM

## 2020-12-18 NOTE — Lactation Note (Signed)
This note was copied from a baby's chart. Lactation Consultation Note  Patient Name: Sherry Clarke [id number] Date: 12/18/2020 Reason for consult: Follow-up assessment Age:26 hours   LC Follow Up Visit:  Attempted to visit with mother, however, she is asleep.  Will return later today.   Maternal Data    Feeding Feeding Type: Bottle Fed - Formula Nipple Type: Slow - flow  LATCH Score                   Interventions    Lactation Tools Discussed/Used     Consult Status Consult Status: Follow-up Date: 12/18/20 Follow-up type: In-patient    Rakeen Gaillard R Danis Pembleton 12/18/2020, 8:02 AM

## 2021-01-07 ENCOUNTER — Inpatient Hospital Stay (HOSPITAL_COMMUNITY): Admit: 2021-01-07 | Payer: Self-pay

## 2021-02-06 ENCOUNTER — Ambulatory Visit: Payer: Self-pay

## 2021-02-08 ENCOUNTER — Ambulatory Visit: Payer: Self-pay

## 2021-03-04 ENCOUNTER — Other Ambulatory Visit: Payer: Self-pay

## 2021-03-04 ENCOUNTER — Emergency Department (INDEPENDENT_AMBULATORY_CARE_PROVIDER_SITE_OTHER)
Admission: EM | Admit: 2021-03-04 | Discharge: 2021-03-04 | Disposition: A | Discharge status: Home / Self Care | Payer: Medicaid Other | Source: Home / Self Care | Attending: Family Medicine | Admitting: Family Medicine

## 2021-03-04 DIAGNOSIS — R197 Diarrhea, unspecified: Secondary | ICD-10-CM

## 2021-03-04 DIAGNOSIS — R112 Nausea with vomiting, unspecified: Secondary | ICD-10-CM | POA: Diagnosis not present

## 2021-03-04 MED ORDER — KETOROLAC TROMETHAMINE 60 MG/2ML IM SOLN
60.0000 mg | Freq: Once | INTRAMUSCULAR | Status: AC
  Administered 2021-03-04: 60 mg via INTRAMUSCULAR

## 2021-03-04 MED ORDER — METOCLOPRAMIDE HCL 5 MG/ML IJ SOLN
10.0000 mg | Freq: Once | INTRAMUSCULAR | Status: AC
  Administered 2021-03-04: 10 mg via INTRAMUSCULAR

## 2021-03-04 NOTE — ED Provider Notes (Signed)
[facility]    CSN: [id number] Arrival date & time: 03/04/21  1101      History   Chief Complaint Chief Complaint  Patient presents with  . Abdominal Pain  . Emesis    HPI Sherry Clarke is a 25 y.o. female.   HPI   Patient lives at home with her significant other and their 59-1/2-week old infant boy.  Both she and her female friend of had nausea vomiting and diarrhea since the weekend.  He is better.  She is not.  She started off with watery diarrhea for the first couple of days, felt better for a day, then developed nausea and vomiting.  She states that she is able to keep down water.  She is taking both Phenergan and Zofran.  They help temporarily.  No fever or chills.  No cough or runny nose.  Patient is an insulin-dependent diabetic.  She is managing her diabetes by checking sugar frequently.  Her sugar this morning was 200.  Past Medical History:  Diagnosis Date  . Diabetes mellitus without complication [facility])     Patient Active Problem List   Diagnosis Date Noted  . Diabetes mellitus affecting pregnancy 12/18/2020  . Pre-eclampsia during pregnancy in third trimester, antepartum 12/13/2020    Past Surgical History:  Procedure Laterality Date  . WRIST FUSION Left 2020    OB History    Gravida  1   Para  1   Term      Preterm  1   AB      Living  1     SAB      IAB      Ectopic      Multiple  0   Live Births  1            Home Medications    Prior to Admission medications   Medication Sig Start Date End Date Taking? Authorizing Provider  acetaminophen (TYLENOL) 325 MG tablet Take 2 tablets (650 mg total) by mouth every 4 (four) hours as needed (for pain scale < 4). 12/18/20   Taam-Akelman, Griselda Miner, MD  albuterol (VENTOLIN HFA) 108 (90 Base) MCG/ACT inhaler Inhale 2 puffs into the lungs every 6 (six) hours as needed for wheezing or shortness of breath. 12/07/20   Levie Heritage, DO  cyclobenzaprine (FLEXERIL) 10 MG tablet Take 1  tablet (10 mg total) by mouth 2 (two) times daily as needed (tension headache). 12/12/20   Armando Reichert, CNM  fluticasone (FLONASE) 50 MCG/ACT nasal spray Place 1 spray into both nostrils in the morning and at bedtime for 14 days. 12/07/20 12/21/20  Levie Heritage, DO  ibuprofen (ADVIL) 600 MG tablet Take 1 tablet (600 mg total) by mouth every 6 (six) hours as needed for cramping. 12/18/20   Taam-Akelman, Griselda Miner, MD  insulin aspart (NOVOLOG) 100 UNIT/ML injection Inject 4 Units into the skin 3 (three) times daily with meals. 12/18/20   Taam-Akelman, Griselda Miner, MD  insulin glargine (LANTUS) 100 UNIT/ML injection Inject 0.08 mLs (8 Units total) into the skin at bedtime. 12/18/20   Taam-Akelman, Griselda Miner, MD  NIFEdipine (PROCARDIA XL) 90 MG 24 hr tablet Take 1 tablet (90 mg total) by mouth daily. 12/18/20   Taam-Akelman, Griselda Miner, MD  Prenatal Vit-Fe Fumarate-FA (PRENATAL MULTIVITAMIN) TABS tablet Take 1 tablet by mouth daily at 12 noon.    [provider]  senna-docusate (SENOKOT-S) 8.6-50 MG tablet Take 2 tablets by mouth daily. 12/19/20  Rande Brunt, MD    Family History Family History  Problem Relation Age of Onset  . Hypertension Mother   . Heart disease Mother     Social History Social History   Tobacco Use  . Smoking status: Never Smoker  . Smokeless tobacco: Never Used  Vaping Use  . Vaping Use: Never used  Substance Use Topics  . Alcohol use: Never  . Drug use: Never     Allergies   Oxycodone   Review of Systems Review of Systems See HPI  Physical Exam Triage Vital Signs ED Triage Vitals  Enc Vitals Group     BP 03/04/21 1119 106/77     Pulse Rate 03/04/21 1111 (!) 114     Resp 03/04/21 1111 18     Temp 03/04/21 1111 98.5 F (36.9 C)     Temp Source 03/04/21 1111 Oral     SpO2 03/04/21 1111 97 %     Weight --      Height --      Head Circumference --      Peak Flow --      Pain Score 03/04/21 1116 7     Pain Loc --      Pain Edu? --       Excl. in GC? --    No data found.  Updated Vital Signs BP 106/77 (BP Location: Left Arm)   Pulse (!) 114   Temp 98.5 F (36.9 C) (Oral)   Resp 18   LMP 03/17/2020 (Approximate)   SpO2 97%  :     Physical Exam Constitutional:      General: She is not in acute distress.    Appearance: She is well-developed. She is obese.  HENT:     Head: Normocephalic and atraumatic.  Eyes:     Conjunctiva/sclera: Conjunctivae normal.     Pupils: Pupils are equal, round, and reactive to light.  Cardiovascular:     Rate and Rhythm: Normal rate and regular rhythm.  Pulmonary:     Effort: Pulmonary effort is normal. No respiratory distress.     Breath sounds: Normal breath sounds.  Abdominal:     General: There is no distension.     Palpations: Abdomen is soft.     Tenderness: There is abdominal tenderness in the epigastric area.  Musculoskeletal:        General: Normal range of motion.     Cervical back: Normal range of motion.  Skin:    General: Skin is warm and dry.  Neurological:     Mental Status: She is alert.   Worry about tachycardia given patient's protracted vomiting.  She does have moist mucous membranes.  No orthostatic symptoms.  I have advised her she may have early dehydration and needs to push her fluids.   UC Treatments / Results  Labs (all labs ordered are listed, but only abnormal results are displayed) Labs Reviewed - No data to display  EKG   Radiology No results found.  Procedures Procedures (including critical care time)  Medications Ordered in UC Medications  ketorolac (TORADOL) injection 60 mg (60 mg Intramuscular Given 03/04/21 1221)  metoCLOPramide (REGLAN) injection 10 mg (10 mg Intramuscular Given 03/04/21 1221)    Initial Impression / Assessment and Plan / UC Course  I have reviewed the triage vital signs and the nursing notes.  Pertinent labs & imaging results that were available during my care of the patient were reviewed by me and  considered in my medical  decision making (see chart for details).    Will treat with Toradol for the crampy pain.  Reglan for the vomiting.  Home to rest.  Continue to try to push fluids.  Add solid food as soon as capable.  To ER if worse Final Clinical Impressions(s) / UC Diagnoses   Final diagnoses:  Nausea vomiting and diarrhea     Discharge Instructions     Home to rest Continue to push lots of water Add a bland diet as soon as the vomiting will allow you to eat Continue Phenergan or Zofran for nausea and vomiting. Dose of Phenergan is 12-1/2 or 25, dose of Zofran is 4 or 8 If you get worse instead of better, if your sugar goes over 400, go to ER   ED Prescriptions    None     PDMP not reviewed this encounter.   Eustace Moore, MD 03/04/21 [phone]

## 2021-03-04 NOTE — Discharge Instructions (Signed)
Home to rest Continue to push lots of water Add a bland diet as soon as the vomiting will allow you to eat Continue Phenergan or Zofran for nausea and vomiting. Dose of Phenergan is 12-1/2 or 25, dose of Zofran is 4 or 8 If you get worse instead of better, if your sugar goes over 400, go to ER

## 2021-03-04 NOTE — ED Triage Notes (Signed)
Pt c/o stomach pain (upper abd) and vomiting since 7pm last night. Hx of acid reflux. Zofran 3 hours ago. Also taking promethazine prn.  Diarrhea Sunday and Monday which has resolved.

## 2022-03-03 IMAGING — US US MFM OB DETAIL+14 WK
1 series · 13 of 28 positions shown · non-contrast
Comparison: none

[Series 1: us mfm ob detail+14 wk · 13 of 92 slices shown]
[im 4/92]
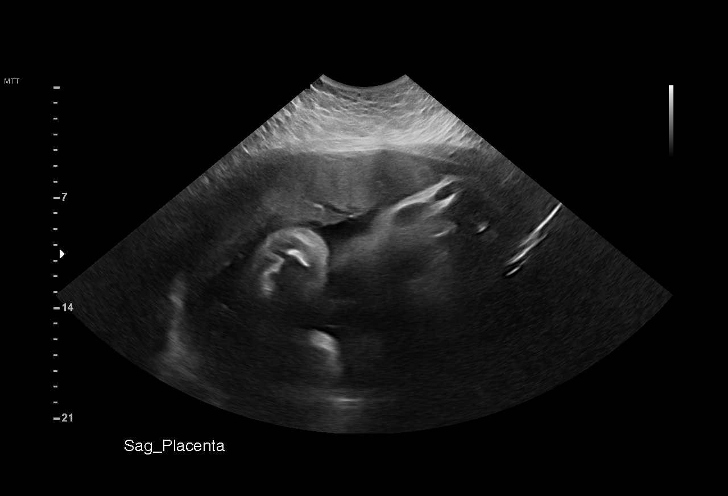
[im 11/92]
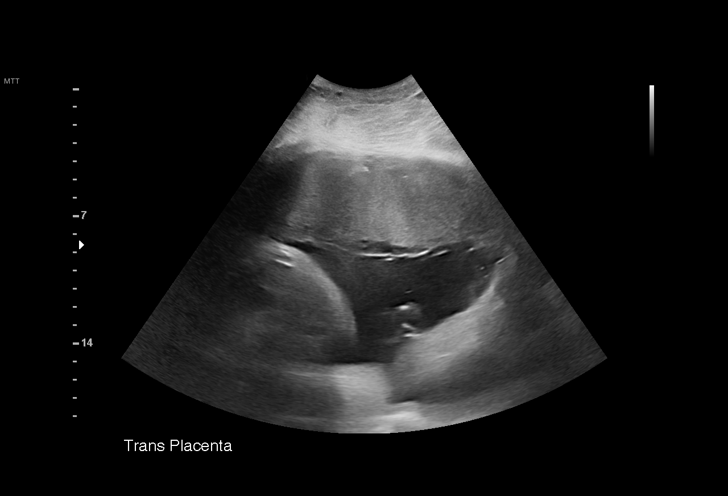
[im 17/92]
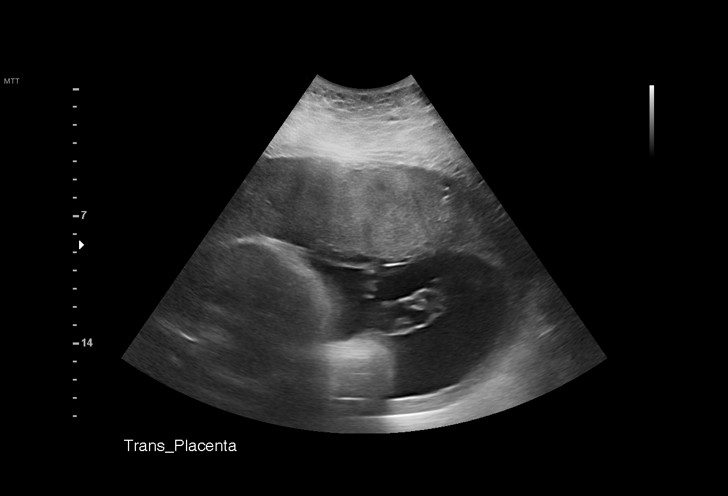
[im 24/92]
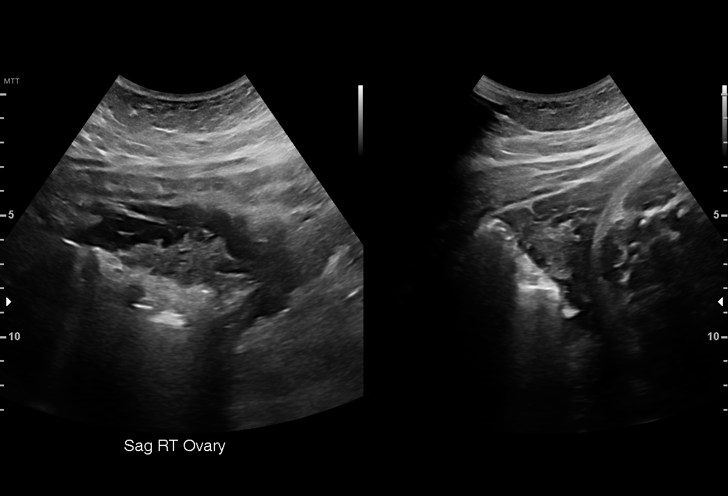
[im 31/92]
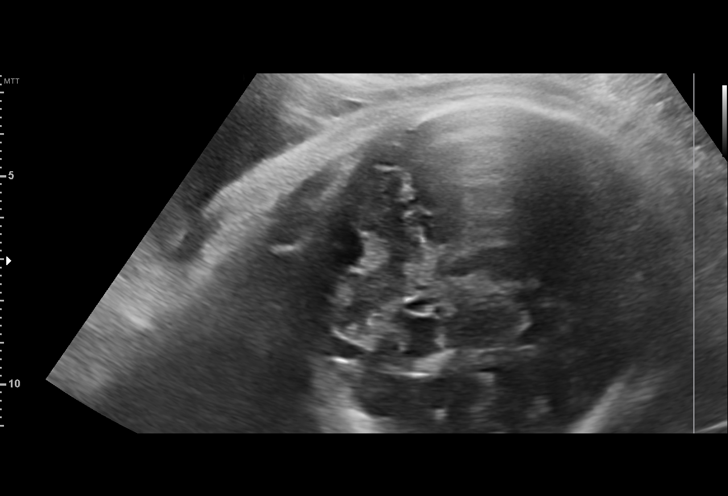
[im 38/92]
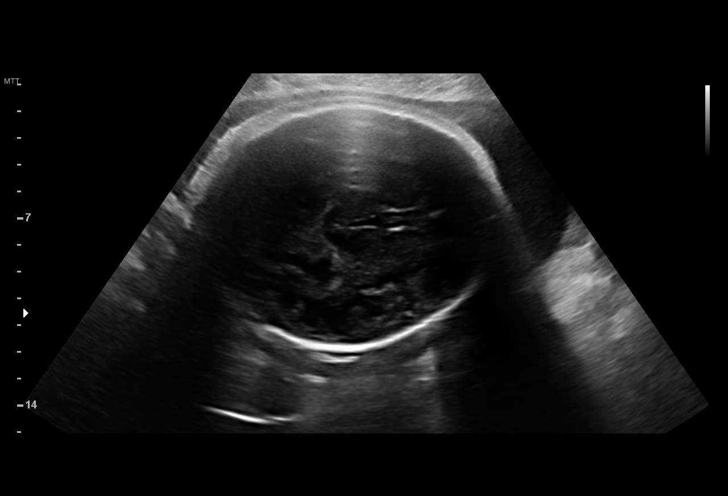
[im 48/92]
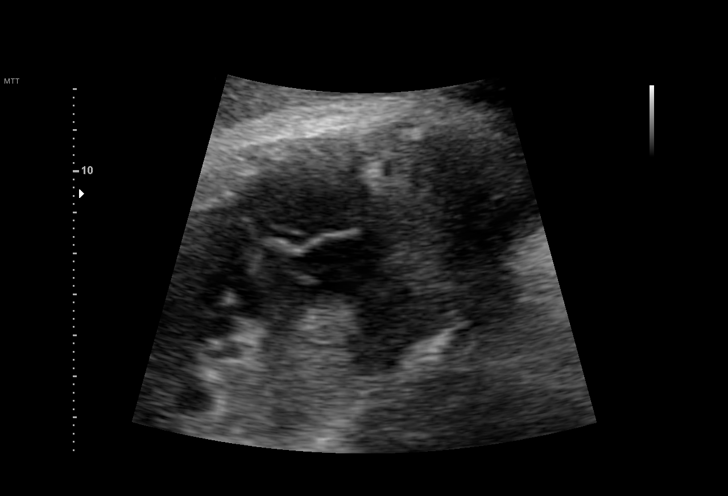
[im 54/92]
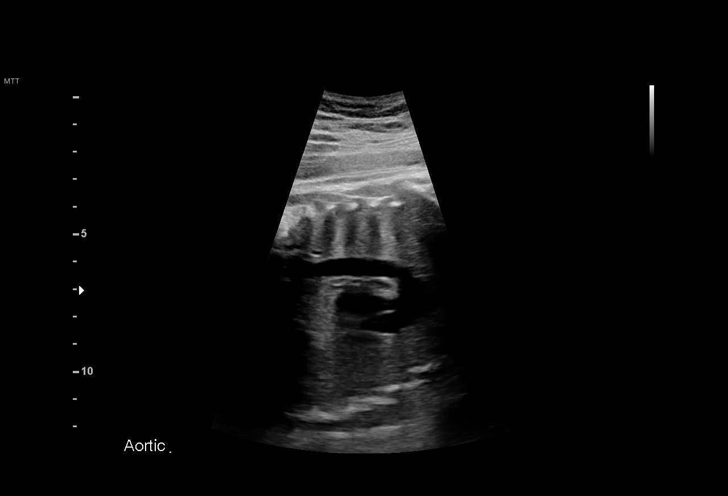
[im 61/92]
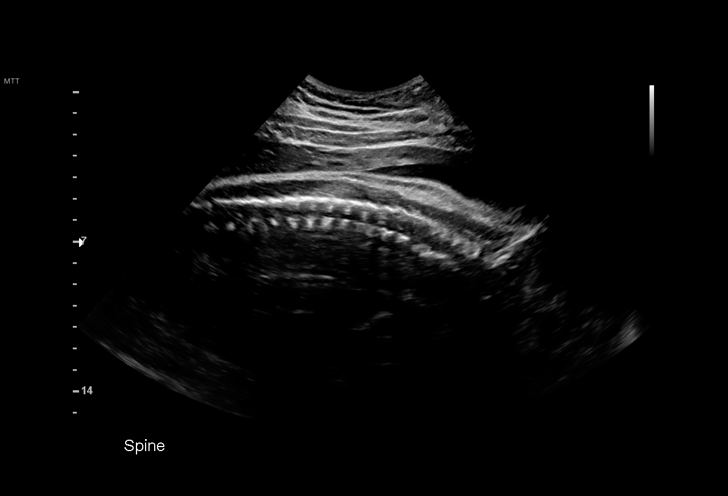
[im 68/92]
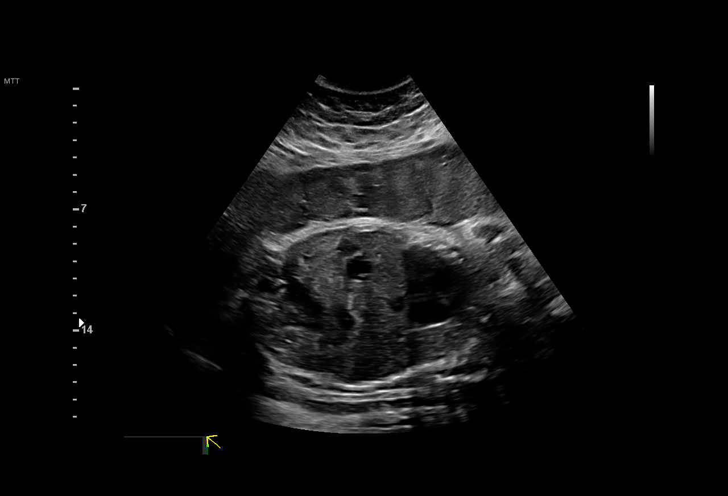
[im 75/92]
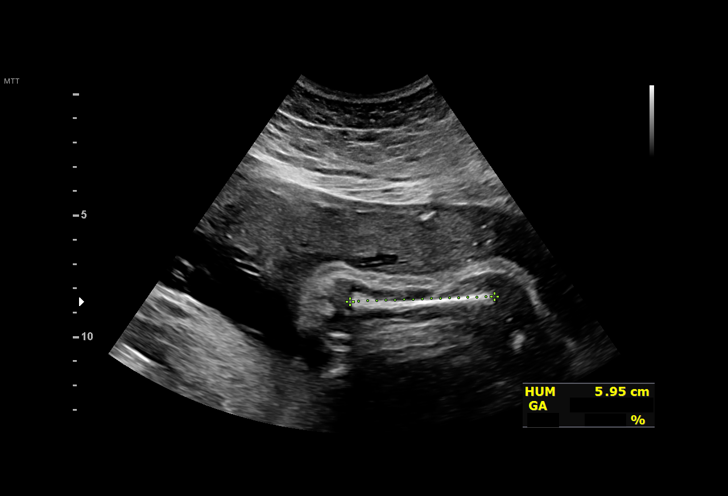
[im 81/92]
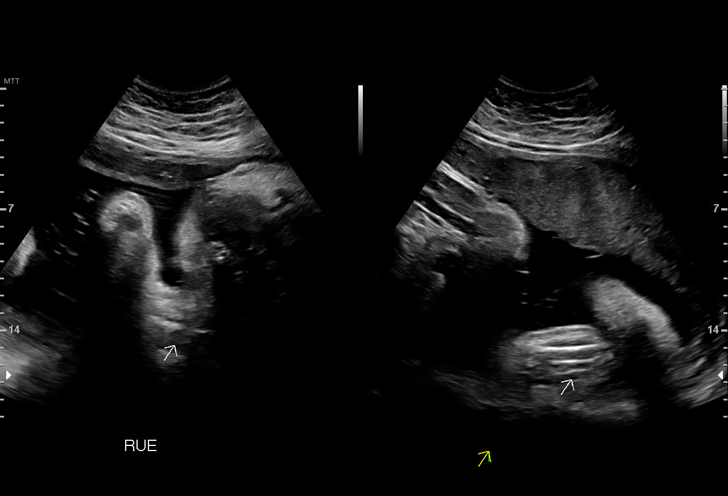
[im 88/92]
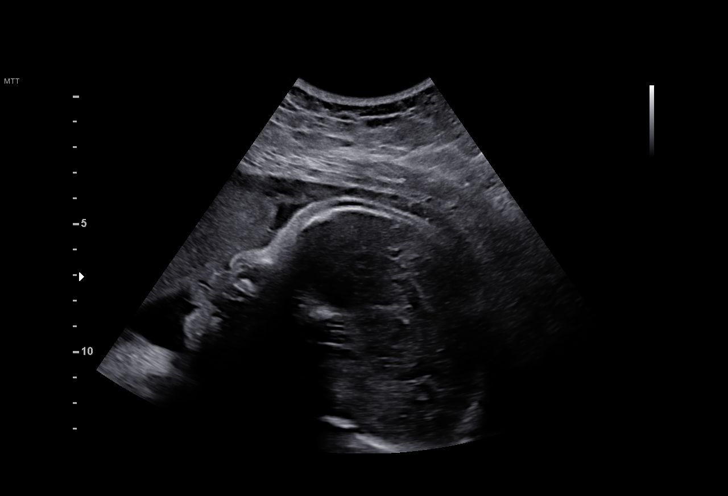

[13 of 28 positions shown; findings below may reference images not displayed]

OBGYN
                                                            [REDACTED]
                   [facility]

Indications

 Diabetes - Pregestational,3rd trimester
 (Insulin)
 34 weeks gestation of pregnancy
 Obesity complicating pregnancy, third
 trimester (BMI 43)
 Encounter for antenatal screening for
 malformations
Fetal Evaluation

 Num Of Fetuses:         1
 Fetal Heart Rate(bpm):  141
 Cardiac Activity:       Observed
 Presentation:           Cephalic
 Placenta:               Anterior
 P. Cord Insertion:      Visualized, central

 Amniotic Fluid
 AFI FV:      Within normal limits

 AFI Sum(cm)     %Tile       Largest Pocket(cm)
 19.1            71

 RUQ(cm)       RLQ(cm)       LUQ(cm)        LLQ(cm)

Biophysical Evaluation

 Amniotic F.V:   Pocket => 2 cm             F. Tone:        Observed
 F. Movement:    Observed                   Score:          [DATE]
 F. Breathing:   Observed
Biometry

 BPD:      89.6  mm     G. Age:  36w 2d         95  %    CI:        77.71   %    70 - 86
                                                         FL/HC:      21.0   %    19.4 -
 HC:      321.7  mm     G. Age:  36w 2d         73  %    HC/AC:      0.97        0.96 -
 AC:      330.9  mm     G. Age:  37w 0d       > 99  %    FL/BPD:     75.2   %    71 - 87
 FL:       67.4  mm     G. Age:  34w 5d         58  %    FL/AC:      20.4   %    20 - 24
 HUM:      57.5  mm     G. Age:  33w 2d         44  %
 CER:        44  mm     G. Age:  34w 3d         35  %

 LV:        6.3  mm
 CM:        9.2  mm

 Est. FW:    2332  gm      6 lb 6 oz     96  %
OB History

 Gravidity:    1         Term:   0        Prem:   0        SAB:   0
 TOP:          0       Ectopic:  0        Living: 0
Gestational Age

 LMP:           34w 0d        Date:  04/02/20                 EDD:   01/07/21
 U/S Today:     36w 1d                                        EDD:   12/23/20
 Best:          34w 0d     Det. By:  LMP  (04/02/20)          EDD:   01/07/21
Anatomy

 Cranium:               Appears normal         LVOT:                   Not well visualized
 Cavum:                 Appears normal         Aortic Arch:            Appears normal
 Ventricles:            Appears normal         Ductal Arch:            Not well visualized
 Choroid Plexus:        Appears normal         Diaphragm:              Appears normal
 Cerebellum:            Appears normal         Stomach:                Appears normal, left
                                                                       sided
 Posterior Fossa:       Appears normal         Abdomen:                Appears normal
 Nuchal Fold:           Not applicable (>20    Abdominal Wall:         Appears nml (cord
                        wks GA)                                        insert, abd wall)
 Face:                  Not well visualized    Cord Vessels:           Appears normal (3
                                                                       vessel cord)
 Lips:                  Not well visualized    Bladder:                Appears normal
 Thoracic:              Appears normal         Spine:                  Appears normal
 Heart:                 Not well visualized    Upper Extremities:      Visualized Limited
 RVOT:                  Not well visualized    Lower Extremities:      Visualized Limited

 Other:  Technicallly difficult due to advanced GA, fetal position, and maternal
         habitus.
Cervix Uterus Adnexa

 Cervix
 Not visualized (advanced GA >12wks)
 Uterus
 No abnormality visualized.

 Right Ovary
 Within normal limits.

 Left Ovary
 Within normal limits.

 Cul De Sac
 No free fluid seen.

 Adnexa
 No abnormality visualized.
Comments

 This patient was seen for an ultrasound exam due to
 pregestational diabetes that is currently treated with Lantus
 and NovoLog insulin.  She reports that her fingerstick values
 have mostly been within normal limits with an occasional high
 value.  Her pregnancy has also been complicated by
 maternal obesity.  She denies any other problems in her
 current pregnancy.
 She was informed that the fetal growth measures large for
 her gestational age (6 pounds 6 ounces, 96th percentile for
 her gestational age).  There was normal amniotic fluid noted
 today.
 The views of the fetal anatomy were limited today due to her
 advanced national age and body habitus.
 A biophysical profile performed today was [DATE].
 Due to pregestational diabetes, she should continue the twice
 weekly nonstress test in your office until delivery.
 Due to the large fetal size noted today, she should have
 another growth ultrasound performed in 3 to 4 weeks. Please
 call our office should you want this ultrasound to be
 performed in our office.

## 2022-03-14 IMAGING — DX DG CHEST 1V PORT
1 series · 1 of 1 positions shown · non-contrast
Comparison: Chest radiograph dated 09/11/2020.

CLINICAL DATA: 24-year-old female with shortness of breath.
Positive [id number]-GX.

EXAM:
PORTABLE CHEST 1 VIEW

[chest]
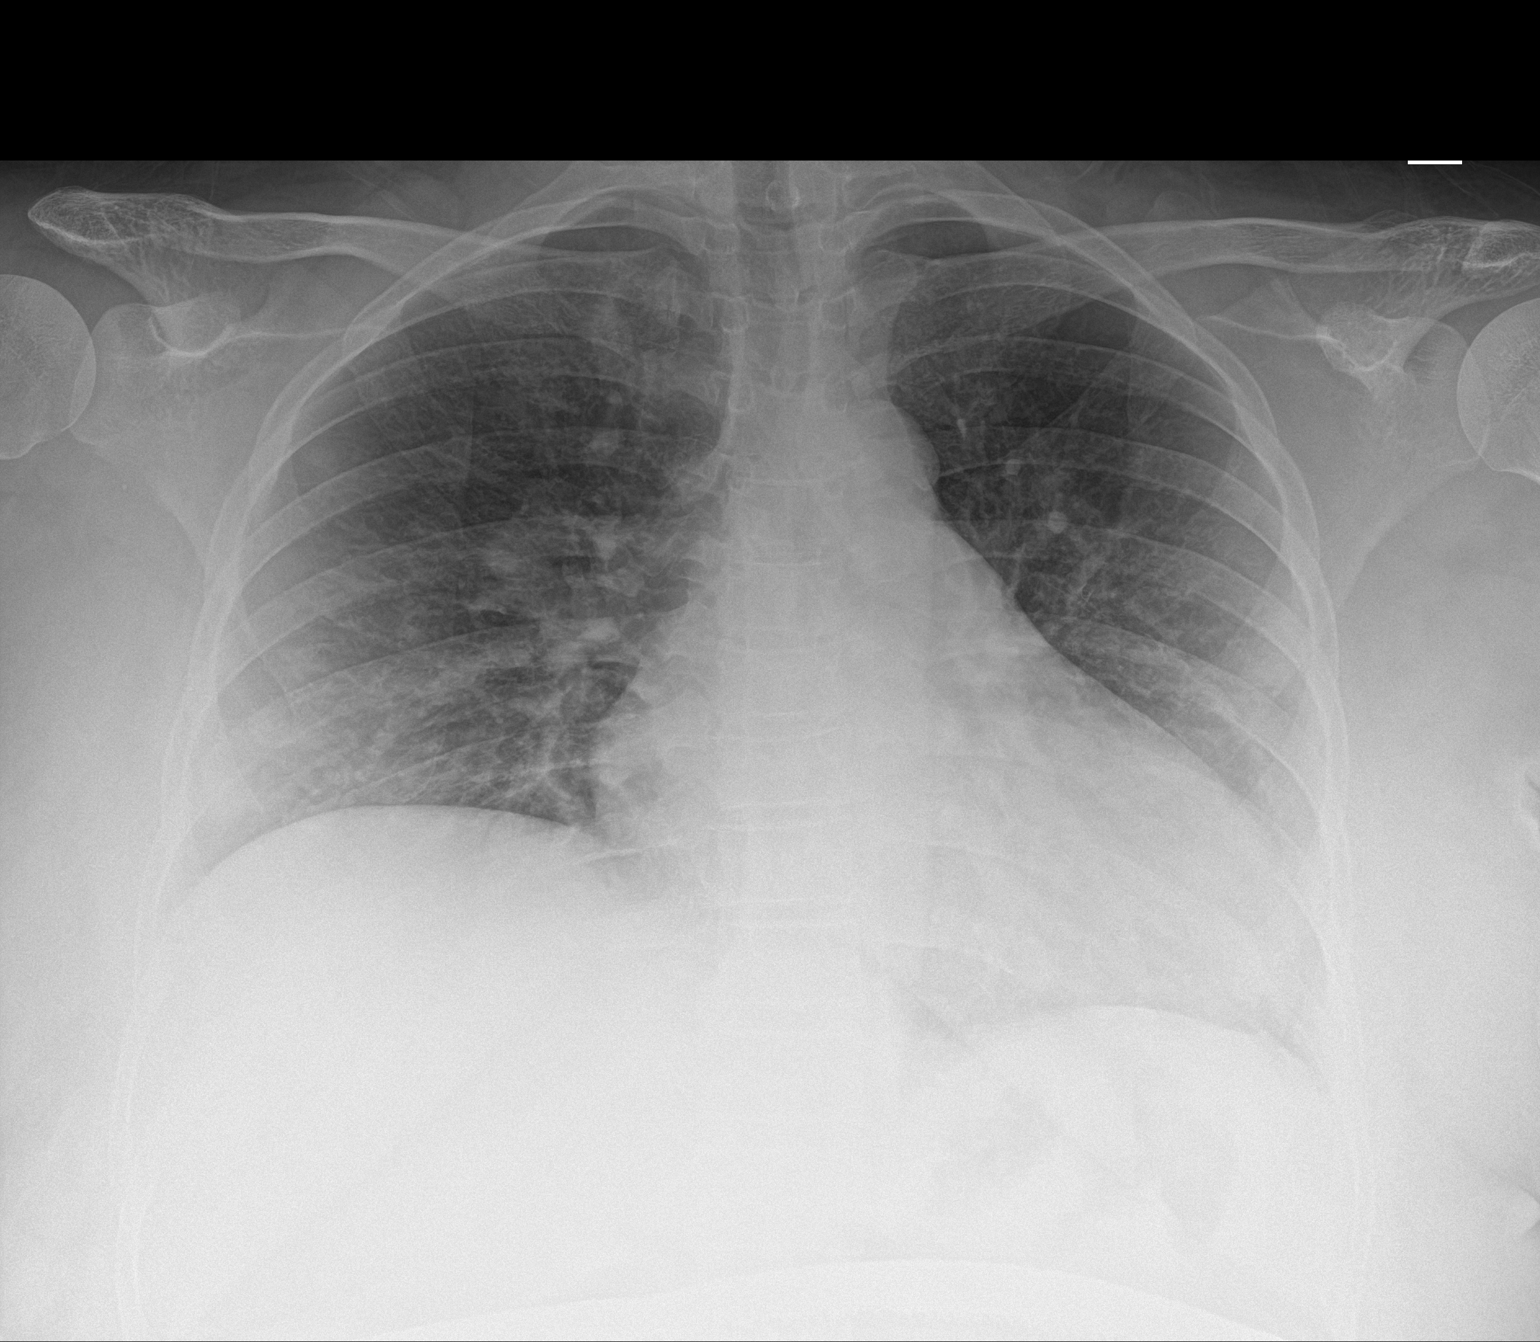

[1 of 1 positions shown; findings below may reference images not displayed]

FINDINGS: Shallow inspiration. Faint bilateral interstitial densities, likely
atelectasis. Developing atypical infiltrate is less likely but not
excluded clinical correlation is recommended. No focal
consolidation, pleural effusion, pneumothorax. Stable cardiac
silhouette. No acute osseous pathology.
IMPRESSION: No focal consolidation.
